# Patient Record
Sex: Female | Born: 1965 | Race: White | Hispanic: No | Marital: Married | Smoking: Never smoker
Health system: Southern US, Community
[De-identification: ages and names within clinical notes are randomized; demographics above are authoritative.]

## PROBLEM LIST (undated history)

## (undated) DIAGNOSIS — IMO0002 Reserved for concepts with insufficient information to code with codable children: Secondary | ICD-10-CM

## (undated) DIAGNOSIS — N83202 Unspecified ovarian cyst, left side: Secondary | ICD-10-CM

## (undated) DIAGNOSIS — H409 Unspecified glaucoma: Secondary | ICD-10-CM

## (undated) DIAGNOSIS — N83201 Unspecified ovarian cyst, right side: Secondary | ICD-10-CM

## (undated) DIAGNOSIS — I10 Essential (primary) hypertension: Secondary | ICD-10-CM

## (undated) DIAGNOSIS — A0472 Enterocolitis due to Clostridium difficile, not specified as recurrent: Secondary | ICD-10-CM

## (undated) DIAGNOSIS — R87619 Unspecified abnormal cytological findings in specimens from cervix uteri: Secondary | ICD-10-CM

## (undated) DIAGNOSIS — D219 Benign neoplasm of connective and other soft tissue, unspecified: Secondary | ICD-10-CM

## (undated) DIAGNOSIS — D649 Anemia, unspecified: Secondary | ICD-10-CM

## (undated) HISTORY — DX: Unspecified ovarian cyst, left side: N83.202

## (undated) HISTORY — DX: Anemia, unspecified: D64.9

## (undated) HISTORY — DX: Unspecified abnormal cytological findings in specimens from cervix uteri: R87.619

## (undated) HISTORY — DX: Enterocolitis due to Clostridium difficile, not specified as recurrent: A04.72

## (undated) HISTORY — DX: Reserved for concepts with insufficient information to code with codable children: IMO0002

## (undated) HISTORY — DX: Unspecified glaucoma: H40.9

## (undated) HISTORY — DX: Essential (primary) hypertension: I10

## (undated) HISTORY — DX: Benign neoplasm of connective and other soft tissue, unspecified: D21.9

## (undated) HISTORY — DX: Unspecified ovarian cyst, left side: N83.201

---

## 1993-05-30 HISTORY — PX: CERVICAL BIOPSY  W/ LOOP ELECTRODE EXCISION: SUR135

## 2012-10-29 ENCOUNTER — Ambulatory Visit (INDEPENDENT_AMBULATORY_CARE_PROVIDER_SITE_OTHER): Payer: PRIVATE HEALTH INSURANCE | Admitting: Obstetrics and Gynecology

## 2012-10-29 ENCOUNTER — Encounter: Payer: Self-pay | Admitting: Obstetrics and Gynecology

## 2012-10-29 VITALS — BP 130/72 | Ht 64.75 in | Wt 186.0 lb

## 2012-10-29 DIAGNOSIS — N920 Excessive and frequent menstruation with regular cycle: Secondary | ICD-10-CM

## 2012-10-29 DIAGNOSIS — Z01419 Encounter for gynecological examination (general) (routine) without abnormal findings: Secondary | ICD-10-CM

## 2012-10-29 NOTE — Progress Notes (Addendum)
Patient ID: Lindsey Giles, female   DOB: 26-Oct-1965, 47 y.o.   MRN: [id number] 47 y.o.   Married    Caucasian   female   G1P1001   here for annual exam.  Saw PCP and had W/U for fatigue.  Ferritin found to be very very low..   Hgb was 12 g  Started Fe supplement and Hgb came back better.  Records not better.  Menses are very heavy. PCP did PUS and was told she had a small cyst.  Had Ca 125 that PCP was concerned enough about that pt was referred to onc at [facility] who told her all ok and just get routine exams.  Menses are q26 days, last for 7 days, heavy for 2-3 days.  No socially embarrassing accidents.    H/O LEEP in 1995 for dysplasia.  Paps since were normal.    Patient's last menstrual period was 10/14/2012.          Sexually active: yes  The current method of family planning is none. Condoms, sex infreq.  He is 20 years older than she is.     Exercising: walking Last mammogram: 08/01/2012   Last pap smear: 12/06/10 History of abnormal pap: yes  Smoking: never Alcohol: none Last colonoscopy: never Last Bone Density: never  Last tetanus shot: 2005 Last cholesterol check: 03/2012   Hgb: 14.7g          Urine:    Family History  Problem Relation Age of Onset  . Hypertension Mother   . Hypertension Father   . Breast cancer Maternal Grandmother     meta to brain  . Alzheimer's disease Maternal Grandfather   . Colon cancer Paternal Grandmother   . Heart attack Paternal Grandfather 70    There are no active problems to display for this patient.   Past Medical History  Diagnosis Date  . Hypertension   . Anemia   . Glaucoma   . Abnormal Pap smear     Past Surgical History  Procedure Laterality Date  . Cervical biopsy  w/ loop electrode excision  1995    Allergies: Sudafed  Current Outpatient Prescriptions  Medication Sig Dispense Refill  . dorzolamide-timolol (COSOPT) 22.3-6.8 MG/ML ophthalmic solution Place 1 drop into the left eye 2 (two) times daily.      Marland Kitchen  FeFum-FePoly-FA-B Cmp-C-Biot (INTEGRA PLUS) CAPS Take 1 tablet by mouth daily.      Marland Kitchen lisinopril (PRINIVIL,ZESTRIL) 10 MG tablet Take 1 tablet by mouth daily.       No current facility-administered medications for this visit.    ROS: Pertinent items are noted in HPI.  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  Pt states she worried a lot about her health and always worries about the worst whenever she has a test.    Exam:    BP 130/72  Ht 5' 4.75" (1.645 m)  Wt 186 lb (84.369 kg)  BMI 31.18 kg/m2  LMP 10/14/2012   Wt Readings from Last 3 Encounters:  10/29/12 186 lb (84.369 kg)     Ht Readings from Last 3 Encounters:  10/29/12 5' 4.75" (1.645 m)    General appearance: alert, cooperative and appears stated age Head: Normocephalic, without obvious abnormality, atraumatic Neck: no adenopathy, supple, symmetrical, trachea midline and thyroid not enlarged, symmetric, no tenderness/mass/nodules Lungs: clear to auscultation  bilaterally Breasts: Inspection negative, No nipple retraction or dimpling, No nipple discharge or bleeding, No axillary or supraclavicular adenopathy, Normal to palpation without dominant masses Heart: regular rate and rhythm Abdomen: soft, non-tender; bowel sounds normal; no masses,  no organomegaly Extremities: extremities normal, atraumatic, no cyanosis or edema Skin: Skin color, texture, turgor normal. No rashes or lesions Lymph nodes: Cervical, supraclavicular, and axillary nodes normal. No abnormal inguinal nodes palpated Neurologic: Grossly normal   Pelvic: External genitalia:  no lesions              Urethra:  normal appearing urethra with no masses, tenderness or lesions              Bartholins and Skenes: normal                 Vagina: normal appearing vagina with normal color and discharge, no lesions               Cervix: normal appearance              Pap taken: no        Bimanual Exam:  Uterus:  uterus is normal size, shape, consistency and nontender, mid position, mobile.  Fundus firm, maybe a small fibroid.                                      Adnexa: normal adnexa in size, nontender and no masses                                      Rectovaginal: Confirms                                      Anus:  normal sphincter tone, no lesions  A: normal perimenopausal exam     menorhagia     Glaucoma s/p traumatic injury when young     HTN     P: mammogram counseled on breast self exam, mammography screening, adequate intake of calcium and vitamin D, diet and exercise return annually or prn   Check SHSG and PUS, then approp mgt.    An After Visit Summary was printed and given to the patient.   Review of records:  PUS done September 20, 2012 showed the uterus was 14 x 4 x 8 cm, no discrete fibroids seen.  EMS 3mm.  Rt ovary with a 5 x 2 x 3 cm septated cyst.  Left ovary nl.  She was advised to start Depo-provera to help the bleeding, but she did not want to do that.  He also rec: an upper and lower endoscopy, which pt is reluctant to do as well.    In 2007, she was seen at [facility] and had a PUS showing her uterus was 10 x 5 x 7 with three small fibroids about 1 cm each, possible adenomyosis, and an endometrial polyp.  Both adnexa had small paraovarian cysts.

## 2012-10-29 NOTE — Progress Notes (Signed)
Very heavy first three days, then lighter 3-4 days, then heavy last day

## 2012-10-29 NOTE — Patient Instructions (Addendum)

## 2012-10-30 ENCOUNTER — Telehealth: Payer: Self-pay | Admitting: Obstetrics and Gynecology

## 2012-10-30 NOTE — Telephone Encounter (Signed)
Patient calling re: hoped to have ultrasound tomorrow, 10/31/12, as her insurance will be invalid this Friday due to starting a new job. Patient has not heard back from Carolynn yet who is out of the office this afternoon. If it's possible to get the patient in tomorrow afternoon, please let her know. She will need at least 1.5 hours notice to drive to the appointment.

## 2012-10-31 NOTE — Telephone Encounter (Signed)
Advised patient that no available appointments this week.  Ultrasound schedule is full and have not had cancelations. Patient states this is for menorrhagia and she is ok to wait a few weeks until she can take a day off work.  Briefly reviewed procedure and that most people do not need a whole day off work, can sched early am or late pm appt to assist her.  She will call me back to schedule once she has time at new job.

## 2012-11-23 ENCOUNTER — Telehealth: Payer: Self-pay | Admitting: Obstetrics and Gynecology

## 2012-11-23 NOTE — Telephone Encounter (Signed)
LMTCB to discuss insurance benefits and schedule pus-shgm.

## 2012-12-11 NOTE — Telephone Encounter (Signed)
Last Aex 10/29/2012 Patient notified of notes stating No Pap test done at visit due to guide lines now of her already having normal pap smear in past year. Patient states will call back and speak to Eber Jones when gets new insurance card at job she is at now and schedule appointment for PUS/ [facility] when she has time off from work.

## 2012-12-11 NOTE — Telephone Encounter (Signed)
Pap results

## 2012-12-27 ENCOUNTER — Telehealth: Payer: Self-pay | Admitting: Obstetrics and Gynecology

## 2012-12-27 NOTE — Telephone Encounter (Signed)
LMTCB with her husband. She was not home.

## 2013-03-13 DIAGNOSIS — Z9849 Cataract extraction status, unspecified eye: Secondary | ICD-10-CM | POA: Insufficient documentation

## 2013-03-30 HISTORY — PX: OTHER SURGICAL HISTORY: SHX169

## 2013-04-04 ENCOUNTER — Other Ambulatory Visit: Payer: Self-pay

## 2013-07-29 ENCOUNTER — Telehealth: Payer: Self-pay | Admitting: Obstetrics and Gynecology

## 2013-07-29 NOTE — Telephone Encounter (Signed)
Patient thinks she needs a pelvic ultrasound and SHSG. Patient is a previous patient of Dr. Joan Flores.  Patient is also having side pain when she is lying down. She is wondering if a MMG would reveal the cause of the side pain.

## 2013-07-30 NOTE — Telephone Encounter (Signed)
I will be happy to see the patient on 08/02/13 to address her concerns. I will close the encounter.

## 2013-07-30 NOTE — Telephone Encounter (Signed)
Spoke with pt who reports she has noticed a lumpy area near her left breast when she lays on her side. Pt says she has always had "lumpy breasts" but this feels different. Pt also has been having irregular periods since seeing CR back in May. Pt has gone 2-3 months without a cycle and recently had spotting with lower abdominal pain. Pt reports CR had recommended a SHGM at her last visit, but pt has switched jobs a few times and couldn't work it out with her schedule. Pt is finally settled with her current job and has some sick days she can use now.  Pt has been told by another doctor that she has a cyst on her ovary and that she was anemic. Pt's records were reviewed by an oncologist at [facility] but he didn't feel he needed to see her. Advised pt to come in as soon as she can to get her breast evaluated, and that Dr. Quincy Simmonds can assess any other concerns and need for [facility] at that time, since she has not been seen by BS yet. Pt agreeable to appt 08-02-13 at 9:30 with BS.

## 2013-08-02 ENCOUNTER — Encounter: Payer: Self-pay | Admitting: Obstetrics and Gynecology

## 2013-08-02 ENCOUNTER — Ambulatory Visit (INDEPENDENT_AMBULATORY_CARE_PROVIDER_SITE_OTHER): Payer: BC Managed Care – PPO | Admitting: Obstetrics and Gynecology

## 2013-08-02 VITALS — BP 138/88 | HR 80 | Resp 16 | Ht 64.75 in | Wt 198.0 lb

## 2013-08-02 DIAGNOSIS — N83201 Unspecified ovarian cyst, right side: Secondary | ICD-10-CM

## 2013-08-02 DIAGNOSIS — N83209 Unspecified ovarian cyst, unspecified side: Secondary | ICD-10-CM

## 2013-08-02 DIAGNOSIS — N644 Mastodynia: Secondary | ICD-10-CM

## 2013-08-02 NOTE — Progress Notes (Signed)
Patient scheduled for dx MMG and L Breast US at [facility] for 3/18 at 1015. Patient agreeable to time/date/location. Placed in hold.    Patient scheduled for PUS 08/29/13. Pelvic U/S scheduled and patient aware/agreeable to time.  Patient verbalized understanding of the U/S appointment cancellation policy. Advised will need to cancel within 72 business hours (3 business days) or will have $100.00 no show fee placed to account.    Records request faxed to [facility] with request to send records to the [facility].  Fax confirmation received.

## 2013-08-02 NOTE — Patient Instructions (Signed)
We will call you to schedule the pelvic ultrasound after we contact your insurance company.

## 2013-08-02 NOTE — Progress Notes (Signed)
1443: Patient called to r/s PUS appt, requests 09/26/13. Appointment scheduled for date and time of choice.

## 2013-08-02 NOTE — Progress Notes (Signed)
Patient ID: Lindsey Giles, female   DOB: 1965/05/31, 48 y.o.   MRN: [id number] GYNECOLOGY VISIT  PCP:   [place]  Referring provider:   HPI: 48 y.o.   Married  Caucasian  female   G1P1001 with Patient's last menstrual period was 07/30/2013.   here for  Left breast tenderness (more under left arm) for one month.  Hurts to lean on her left side - when bra rubs.   Feels muscle soreness. Pain occurred once in a while int past.  No known increase in activity.  No injuries.  Maternal grandmother with breast cancer.  Cousin with breast cancer.   Patient's prior LMP 04/2013 normal.  This menses was a little heavy last 2 days. Changing tampon every 1 1/2 hours.   Last year told by Dr. Joan Flores that she had a cyst on her ovary and that ultrasound and sonohysterogram were recommended.  4.9 cm septated exophytic cyst of the right ovary, left ovary normal, uterus normal, EMS 0.3 cm at outside facility 09/20/12.  Had CA125 by her PCP - thinks it was 12 - 14.  Patient had a phone consult from PCP to oncology at [facility] who said to just follow up with GYN.  This lead to recommendation for pelvic ultrasound by Dr. Joan Flores, but this was not done. History of ruptured ovarian cyst as a young woman.   Hgb:    Not done Urine:  Not done  GYNECOLOGIC HISTORY: Patient's last menstrual period was 07/30/2013. Sexually active:  yes Partner preference: female Contraception:   condoms Menopausal hormone therapy: n/a DES exposure:   no Blood transfusions:   no Sexually transmitted diseases:   ?HPV GYN procedures and prior surgeries:  LEEP 1994 Last mammogram:   08-01-12 wnl: [place] [place] [facility].          Last pap and high risk HPV testing:   12-06-10 wnl:no HPV testing History of abnormal pap smear:  1994 had colposcopy and LEEP procedure.  Paps normal since.   OB History   Grav Para Term Preterm Abortions TAB SAB Ect Mult Living   1 1 1       1        LIFESTYLE: Exercise:     walking          Tobacco:  no Alcohol:  no Drug use:  no  There are no active problems to display for this patient.   Past Medical History  Diagnosis Date  . Hypertension   . Anemia   . Glaucoma   . Abnormal Pap smear     Past Surgical History  Procedure Laterality Date  . Cervical biopsy  w/ loop electrode excision  1995  . Cataract surgery Left 03/2013    Current Outpatient Prescriptions  Medication Sig Dispense Refill  . dorzolamide-timolol (COSOPT) 22.3-6.8 MG/ML ophthalmic solution Place 1 drop into the left eye 2 (two) times daily.      . ferrous sulfate dried (SLOW FE) 160 (50 FE) MG TBCR SR tablet Take 160 mg by mouth daily.      Marland Kitchen lisinopril (PRINIVIL,ZESTRIL) 10 MG tablet Take 1 tablet by mouth daily.      . metroNIDAZOLE (METROGEL) 0.75 % gel Apply 1 application topically 2 (two) times daily.       No current facility-administered medications for this visit.     ALLERGIES: Sudafed  Family History  Problem Relation Age of Onset  . Hypertension Mother   . Hypertension Father   . Breast cancer Maternal  Grandmother     meta to brain  . Alzheimer's disease Maternal Grandfather   . Colon cancer Paternal Grandmother   . Heart attack Paternal Grandfather 60    History   Social History  . Marital Status: Married    Spouse Name: N/A    Number of Children: N/A  . Years of Education: N/A   Occupational History  . Not on file.   Social History Main Topics  . Smoking status: Never Smoker   . Smokeless tobacco: Never Used  . Alcohol Use: No  . Drug Use: No  . Sexual Activity: Yes    Partners: Male    Birth Control/ Protection: Condom   Other Topics Concern  . Not on file   Social History Narrative  . No narrative on file    ROS:  Pertinent items are noted in HPI.  PHYSICAL EXAMINATION:    BP 138/88  Pulse 80  Resp 16  Ht 5' 4.75" (1.645 m)  Wt 198 lb (89.812 kg)  BMI 33.19 kg/m2  LMP 07/30/2013   Wt Readings from Last 3 Encounters:  08/02/13 198 lb (89.812 kg)   10/29/12 186 lb (84.369 kg)     Ht Readings from Last 3 Encounters:  08/02/13 5' 4.75" (1.645 m)  10/29/12 5' 4.75" (1.645 m)    General appearance: alert, cooperative and appears stated age Lungs: clear to auscultation bilaterally Breasts: Inspection negative, No nipple retraction or dimpling, No nipple discharge or bleeding, No axillary or supraclavicular adenopathy, Normal to palpation without dominant masses Heart: regular rate and rhythm Abdomen: soft, non-tender; no masses,  no organomegaly Extremities: extremities normal, atraumatic, no cyanosis or edema Skin: Skin color, texture, turgor normal. No rashes or lesions Lymph nodes: Cervical, supraclavicular, and axillary nodes normal. No abnormal inguinal nodes palpated Neurologic: Grossly normal  Pelvic: External genitalia:  no lesions              Urethra:  normal appearing urethra with no masses, tenderness or lesions              Bartholins and Skenes: normal                 Vagina: normal appearing vagina with normal color and discharge, no lesions.  Menstrual flow.              Cervix: normal appearance                 Bimanual Exam:  Uterus:  uterus is normal size, shape, consistency and nontender                                      Adnexa: normal adnexa in size, nontender and no masses                                    ASSESSMENT Left mastalgia. Right ovarian cyst on prior ultrasound. Skipping menses.  PLAN Bilateral diagnostic mammogram and left breast ultrasound. Return for pelvic ultrasound.   An After Visit Summary was printed and given to the patient.  35 minutes face to face time of which over 50% was spent in counseling.

## 2013-08-05 ENCOUNTER — Telehealth: Payer: Self-pay | Admitting: Obstetrics and Gynecology

## 2013-08-05 NOTE — Telephone Encounter (Signed)
Left message for patient to call back. Need to discuss insurance quote for upcoming service.

## 2013-08-06 NOTE — Telephone Encounter (Signed)
Patient called back. I advised patient that per quoted insurance benefits patient liability for PUS will be $286.65. Payment is due at the time of service. Patient agreeable

## 2013-08-14 ENCOUNTER — Ambulatory Visit
Admission: RE | Admit: 2013-08-14 | Discharge: 2013-08-14 | Disposition: A | Discharge status: Home / Self Care | Payer: BC Managed Care – PPO | Source: Ambulatory Visit | Attending: Obstetrics and Gynecology | Admitting: Obstetrics and Gynecology

## 2013-08-14 DIAGNOSIS — N644 Mastodynia: Secondary | ICD-10-CM

## 2013-08-29 ENCOUNTER — Other Ambulatory Visit: Payer: BC Managed Care – PPO | Admitting: Obstetrics and Gynecology

## 2013-08-29 ENCOUNTER — Other Ambulatory Visit: Payer: BC Managed Care – PPO

## 2013-09-11 ENCOUNTER — Encounter: Payer: Self-pay | Admitting: Obstetrics and Gynecology

## 2013-09-26 ENCOUNTER — Ambulatory Visit (INDEPENDENT_AMBULATORY_CARE_PROVIDER_SITE_OTHER): Payer: BC Managed Care – PPO | Admitting: Obstetrics and Gynecology

## 2013-09-26 ENCOUNTER — Ambulatory Visit (INDEPENDENT_AMBULATORY_CARE_PROVIDER_SITE_OTHER): Payer: BC Managed Care – PPO

## 2013-09-26 ENCOUNTER — Encounter: Payer: Self-pay | Admitting: Obstetrics and Gynecology

## 2013-09-26 VITALS — BP 130/84 | HR 80 | Ht 64.75 in | Wt 199.0 lb

## 2013-09-26 DIAGNOSIS — N83209 Unspecified ovarian cyst, unspecified side: Secondary | ICD-10-CM

## 2013-09-26 DIAGNOSIS — D219 Benign neoplasm of connective and other soft tissue, unspecified: Secondary | ICD-10-CM

## 2013-09-26 DIAGNOSIS — N83202 Unspecified ovarian cyst, left side: Principal | ICD-10-CM

## 2013-09-26 DIAGNOSIS — D259 Leiomyoma of uterus, unspecified: Secondary | ICD-10-CM

## 2013-09-26 DIAGNOSIS — N920 Excessive and frequent menstruation with regular cycle: Secondary | ICD-10-CM

## 2013-09-26 DIAGNOSIS — N83201 Unspecified ovarian cyst, right side: Secondary | ICD-10-CM

## 2013-09-26 NOTE — Patient Instructions (Signed)
Please call if your menstruation becomes too heavy! We have options for treatment.

## 2013-09-26 NOTE — Progress Notes (Signed)
Ultrasound - report and images reviewed with the patient.

## 2013-09-26 NOTE — Progress Notes (Signed)
Patient ID: Lindsey Giles, female   DOB: [birth year], 48 y.o.   MRN: [id number] GYNECOLOGY  VISIT   HPI: 48 y.o.   Married  Caucasian  female   G1P1001 with Patient's last menstrual period was 09/17/2013.   here for  Pelvic ultrasound.   Last year told by Dr. Joan Flores that she had a cyst on her ovary and that ultrasound and sonohysterogram were recommended. 4.9 cm septated exophytic cyst of the right ovary, left ovary normal, uterus normal, EMS 0.3 cm at outside facility 09/20/12. Had CA125 by her PCP - thinks it was 12 - 14. Patient had a phone consult from PCP to oncology at [facility] who said to just follow up with GYN. This lead to recommendation for pelvic ultrasound by Dr. Joan Flores, but this was not done. History of ruptured ovarian cyst as a young woman.   Menses can be heavy.  Pad change up to every 1 1/2 hours.  Starting to skip cycles.   History of anemia.  Declines blood work. Sees PCP next month.   GYNECOLOGIC HISTORY: Patient's last menstrual period was 09/17/2013. Contraception:   condoms Menopausal hormone therapy: n/a        OB History   Grav Para Term Preterm Abortions TAB SAB Ect Mult Living   1 1 1       1          There are no active problems to display for this patient.   Past Medical History  Diagnosis Date  . Hypertension   . Anemia   . Glaucoma   . Abnormal Pap smear     Past Surgical History  Procedure Laterality Date  . Cervical biopsy  w/ loop electrode excision  1995  . Cataract surgery Left 03/2013    Current Outpatient Prescriptions  Medication Sig Dispense Refill  . dorzolamide-timolol (COSOPT) 22.3-6.8 MG/ML ophthalmic solution Place 1 drop into the left eye 2 (two) times daily.      . ferrous sulfate dried (SLOW FE) 160 (50 FE) MG TBCR SR tablet Take 160 mg by mouth daily.      Marland Kitchen lisinopril (PRINIVIL,ZESTRIL) 10 MG tablet Take 1 tablet by mouth daily.      . metroNIDAZOLE (METROGEL) 0.75 % gel Apply 1 application topically 2 (two) times daily.        No current facility-administered medications for this visit.     ALLERGIES: Sudafed  Family History  Problem Relation Age of Onset  . Hypertension Mother   . Hypertension Father   . Breast cancer Maternal Grandmother     meta to brain  . Alzheimer's disease Maternal Grandfather   . Colon cancer Paternal Grandmother   . Heart attack Paternal Grandfather 47    History   Social History  . Marital Status: Married    Spouse Name: N/A    Number of Children: N/A  . Years of Education: N/A   Occupational History  . Not on file.   Social History Main Topics  . Smoking status: Never Smoker   . Smokeless tobacco: Never Used  . Alcohol Use: No  . Drug Use: No  . Sexual Activity: Yes    Partners: Male    Birth Control/ Protection: Condom   Other Topics Concern  . Not on file   Social History Narrative  . No narrative on file    ROS:  Pertinent items are noted in HPI.  PHYSICAL EXAMINATION:    BP 130/84  Pulse 80  Ht 5' 4.75" (1.645 m)  Wt 199 lb (90.266 kg)  BMI 33.36 kg/m2  LMP 09/17/2013     General appearance: alert, cooperative and appears stated age  Ultrasound -  2 fibroids - 16 mm and 19 mm. EMS - 11.87 mm. Right ovary 1 - 2 cm below umbilicus in right lateral quadrant.  39 x 28 m avascular echofree cyst to the right of the ovary.  Left ovary with thin walled cyst 28 x 19 mm.   ASSESSMENT  Bilateral ovarian cysts. Small uterine fibroids. Menorrhagia.   PLAN  Discussion regarding above assessment diagnoses.  Observation of ovarian cysts. Consider yearly ultrasound. I discussed birth control pills and Mirena IUD if menorrhagia becomes problematic for patient.  Return for annual exam and prn.    An After Visit Summary was printed and given to the patient.  __30____ minutes face to face time of which over 50% was spent in counseling.

## 2013-12-13 ENCOUNTER — Ambulatory Visit: Payer: PRIVATE HEALTH INSURANCE | Admitting: Obstetrics and Gynecology

## 2013-12-13 ENCOUNTER — Ambulatory Visit: Payer: PRIVATE HEALTH INSURANCE | Admitting: Gynecology

## 2013-12-13 ENCOUNTER — Ambulatory Visit: Payer: BC Managed Care – PPO | Admitting: Obstetrics and Gynecology

## 2014-01-03 ENCOUNTER — Encounter: Payer: Self-pay | Admitting: Obstetrics and Gynecology

## 2014-01-03 ENCOUNTER — Ambulatory Visit (INDEPENDENT_AMBULATORY_CARE_PROVIDER_SITE_OTHER): Payer: BC Managed Care – PPO | Admitting: Obstetrics and Gynecology

## 2014-01-03 VITALS — BP 104/58 | HR 84 | Resp 14 | Ht 64.5 in | Wt 194.8 lb

## 2014-01-03 DIAGNOSIS — Z01419 Encounter for gynecological examination (general) (routine) without abnormal findings: Secondary | ICD-10-CM

## 2014-01-03 DIAGNOSIS — Z23 Encounter for immunization: Secondary | ICD-10-CM

## 2014-01-03 DIAGNOSIS — Z0000 Encounter for general adult medical examination without abnormal findings: Secondary | ICD-10-CM

## 2014-01-03 LAB — POCT URINALYSIS DIPSTICK
BILIRUBIN UA: NEGATIVE
GLUCOSE UA: NEGATIVE
Ketones, UA: NEGATIVE
Leukocytes, UA: NEGATIVE
Nitrite, UA: NEGATIVE
Protein, UA: NEGATIVE
Urobilinogen, UA: NEGATIVE
pH, UA: 5

## 2014-01-03 MED ORDER — NORETHINDRONE 0.35 MG PO TABS: 1.0000 | ORAL_TABLET | Freq: Every day | ORAL | Status: DC

## 2014-01-03 NOTE — Patient Instructions (Signed)
EXERCISE AND DIET:  We recommended that you start or continue a regular exercise program for good health. Regular exercise means any activity that makes your heart beat faster and makes you sweat.  We recommend exercising at least 30 minutes per day at least 3 days a week, preferably 4 or 5.  We also recommend a diet low in fat and sugar.  Inactivity, poor dietary choices and obesity can cause diabetes, heart attack, stroke, and kidney damage, among others.    ALCOHOL AND SMOKING:  Women should limit their alcohol intake to no more than 7 drinks/beers/glasses of wine (combined, not each!) per week. Moderation of alcohol intake to this level decreases your risk of breast cancer and liver damage. And of course, no recreational drugs are part of a healthy lifestyle.  And absolutely no smoking or even second hand smoke. Most people know smoking can cause heart and lung diseases, but did you know it also contributes to weakening of your bones? Aging of your skin?  Yellowing of your teeth and nails?  CALCIUM AND VITAMIN D:  Adequate intake of calcium and Vitamin D are recommended.  The recommendations for exact amounts of these supplements seem to change often, but generally speaking 600 mg of calcium (either carbonate or citrate) and 800 units of Vitamin D per day seems prudent. Certain women may benefit from higher intake of Vitamin D.  If you are among these women, your doctor will have told you during your visit.    PAP SMEARS:  Pap smears, to check for cervical cancer or precancers,  have traditionally been done yearly, although recent scientific advances have shown that most women can have pap smears less often.  However, every woman still should have a physical exam from her gynecologist every year. It will include a breast check, inspection of the vulva and vagina to check for abnormal growths or skin changes, a visual exam of the cervix, and then an exam to evaluate the size and shape of the uterus and  ovaries.  And after 48 years of age, a rectal exam is indicated to check for rectal cancers. We will also provide age appropriate advice regarding health maintenance, like when you should have certain vaccines, screening for sexually transmitted diseases, bone density testing, colonoscopy, mammograms, etc.   MAMMOGRAMS:  All women over 40 years old should have a yearly mammogram. Many facilities now offer a "3D" mammogram, which may cost around $50 extra out of pocket. If possible,  we recommend you accept the option to have the 3D mammogram performed.  It both reduces the number of women who will be called back for extra views which then turn out to be normal, and it is better than the routine mammogram at detecting truly abnormal areas.    COLONOSCOPY:  Colonoscopy to screen for colon cancer is recommended for all women at age 50.  We know, you hate the idea of the prep.  We agree, BUT, having colon cancer and not knowing it is worse!!  Colon cancer so often starts as a polyp that can be seen and removed at colonscopy, which can quite literally save your life!  And if your first colonoscopy is normal and you have no family history of colon cancer, most women don't have to have it again for 10 years.  Once every ten years, you can do something that may end up saving your life, right?  We will be happy to help you get it scheduled when you are ready.    Be sure to check your insurance coverage so you understand how much it will cost.  It may be covered as a preventative service at no cost, but you should check your particular policy.     Norethindrone tablets (contraception) What is this medicine? NORETHINDRONE (nor eth IN drone) is an oral contraceptive. The product contains a female hormone known as a progestin. It is used to prevent pregnancy. This medicine may be used for other purposes; ask your health care provider or pharmacist if you have questions. COMMON BRAND NAME(S): Camila, Deblitane 28-Day,  Errin, Heather, [place], Jolivette, Donna, Nor-QD, Nora-BE, Norlyroc, Ortho Micronor, [employer] 28-Day What should I tell my health care provider before I take this medicine? They need to know if you have any of these conditions: -blood vessel disease or blood clots -breast, cervical, or vaginal cancer -diabetes -heart disease -kidney disease -liver disease -mental depression -migraine -seizures -stroke -vaginal bleeding -an unusual or allergic reaction to norethindrone, other medicines, foods, dyes, or preservatives -pregnant or trying to get pregnant -breast-feeding How should I use this medicine? Take this medicine by mouth with a glass of water. You may take it with or without food. Follow the directions on the prescription label. Take this medicine at the same time each day and in the order directed on the package. Do not take your medicine more often than directed. Contact your pediatrician regarding the use of this medicine in children. Special care may be needed. This medicine has been used in female children who have started having menstrual periods. A patient package insert for the product will be given with each prescription and refill. Read this sheet carefully each time. The sheet may change frequently. Overdosage: If you think you have taken too much of this medicine contact a poison control center or emergency room at once. NOTE: This medicine is only for you. Do not share this medicine with others. What if I miss a dose? Try not to miss a dose. Every time you miss a dose or take a dose late your chance of pregnancy increases. When 1 pill is missed (even if only 3 hours late), take the missed pill as soon as possible and continue taking a pill each day at the regular time (use a back up method of birth control for the next 48 hours). If more than 1 dose is missed, use an additional birth control method for the rest of your pill pack until menses occurs. Contact your health care  professional if more than 1 dose has been missed. What may interact with this medicine? Do not take this medicine with any of the following medications: -amprenavir or fosamprenavir -bosentan This medicine may also interact with the following medications: -antibiotics or medicines for infections, especially rifampin, rifabutin, rifapentine, and griseofulvin, and possibly penicillins or tetracyclines -aprepitant -barbiturate medicines, such as phenobarbital -carbamazepine -felbamate -modafinil -oxcarbazepine -phenytoin -ritonavir or other medicines for HIV infection or AIDS -St. John's wort -topiramate This list may not describe all possible interactions. Give your health care provider a list of all the medicines, herbs, non-prescription drugs, or dietary supplements you use. Also tell them if you smoke, drink alcohol, or use illegal drugs. Some items may interact with your medicine. What should I watch for while using this medicine? Visit your doctor or health care professional for regular checks on your progress. You will need a regular breast and pelvic exam and Pap smear while on this medicine. Use an additional method of birth control during the first cycle that  you take these tablets. If you have any reason to think you are pregnant, stop taking this medicine right away and contact your doctor or health care professional. If you are taking this medicine for hormone related problems, it may take several cycles of use to see improvement in your condition. This medicine does not protect you against HIV infection (AIDS) or any other sexually transmitted diseases. What side effects may I notice from receiving this medicine? Side effects that you should report to your doctor or health care professional as soon as possible: -breast tenderness or discharge -pain in the abdomen, chest, groin or leg -severe headache -skin rash, itching, or hives -sudden shortness of breath -unusually weak  or tired -vision or speech problems -yellowing of skin or eyes Side effects that usually do not require medical attention (report to your doctor or health care professional if they continue or are bothersome): -changes in sexual desire -change in menstrual flow -facial hair growth -fluid retention and swelling -headache -irritability -nausea -weight gain or loss This list may not describe all possible side effects. Call your doctor for medical advice about side effects. You may report side effects to FDA at 1-800-FDA-1088. Where should I keep my medicine? Keep out of the reach of children. Store at room temperature between 15 and 30 degrees C (59 and 86 degrees F). Throw away any unused medicine after the expiration date. NOTE: This sheet is a summary. It may not cover all possible information. If you have questions about this medicine, talk to your doctor, pharmacist, or health care provider.  2015, Elsevier/Gold Standard. (2012-02-03 16:41:35)  

## 2014-01-03 NOTE — Progress Notes (Signed)
Patient ID: Lindsey Giles, female   DOB: Sep 17, 1965, 48 y.o.   MRN: [id number] GYNECOLOGY VISIT  PCP:   [place]  Referring provider:   HPI: 48 y.o.   Married  Caucasian  female   G1P1001 with Patient's last menstrual period was 12/25/2013.   here for   AEX. This menses is a little more crampy but not too heavy.  Has borderline anemia.  Would like more reliable contraception.   History of HTN.  Ultrasound - 09/26/13 2 fibroids - 16 mm and 19 mm.  EMS - 11.87 mm.  Right ovary 1 - 2 cm below umbilicus in right lateral quadrant. 39 x 28 m avascular echofree cyst to the right of the ovary. Left ovary with thin walled cyst 28 x 19 mm.  Plan for yearly ultrasound.   Son going off to school.  Hgb:    PCP Urine:  Trace RBC's--see LMP--pt. Still spotting  GYNECOLOGIC HISTORY: Patient's last menstrual period was 12/25/2013. Sexually active:  yes Partner preference: female Contraception:  condoms  Menopausal hormone therapy: n/a DES exposure: no   Blood transfusions:   no Sexually transmitted diseases:  no  GYN procedures and prior surgeries:  Colposcopy and LEEP procedure in 1995. Last mammogram: 08-14-13 wnl and had normal ultrasound of left breast:[facility]. Screening mammogram due in March 2016.          Last pap and high risk HPV testing: 12-06-10 wnl  History of abnormal pap smear: 1995 pap with dysplasia and had colposcopy and LEEP procedure.  Paps normal since.    OB History   Grav Para Term Preterm Abortions TAB SAB Ect Mult Living   1 1 1       1        LIFESTYLE: Exercise:   walking            Tobacco:   no Alcohol:    no Drug use:  no  OTHER HEALTH MAINTENANCE: Tetanus/TDap:   2005 Gardisil:              n/a Influenza:           never Zostavax:           n/a  Bone density:      n/a Colonoscopy:    n/a  Cholesterol check:   Per patient 186 -18 months ago  Family History  Problem Relation Age of Onset  . Hypertension Mother   .  Hypertension Father   . Breast cancer Maternal Grandmother     meta to brain  . Alzheimer's disease Maternal Grandfather   . Colon cancer Paternal Grandmother   . Heart attack Paternal Grandfather 70    There are no active problems to display for this patient.  Past Medical History  Diagnosis Date  . Hypertension   . Anemia   . Glaucoma   . Abnormal Pap smear     Past Surgical History  Procedure Laterality Date  . Cervical biopsy  w/ loop electrode excision  1995  . Cataract surgery Left 03/2013    ALLERGIES: Sudafed  Current Outpatient Prescriptions  Medication Sig Dispense Refill  . dorzolamide-timolol (COSOPT) 22.3-6.8 MG/ML ophthalmic solution Place 1 drop into the left eye 2 (two) times daily.      . ferrous sulfate dried (SLOW FE) 160 (50 FE) MG TBCR SR tablet Take 160 mg by mouth daily. Takes with menstrual cycles      . lisinopril (PRINIVIL,ZESTRIL) 10 MG tablet Take 1 tablet  by mouth daily.      . metroNIDAZOLE (METROGEL) 0.75 % gel Apply 1 application topically 2 (two) times daily.       No current facility-administered medications for this visit.     ROS:  Pertinent items are noted in HPI.  SOCIAL HISTORY:  [employer].   PHYSICAL EXAMINATION:    BP 104/58  Pulse 84  Resp 14  Ht 5' 4.5" (1.638 m)  Wt 194 lb 12.8 oz (88.361 kg)  BMI 32.93 kg/m2  LMP 12/25/2013   Wt Readings from Last 3 Encounters:  01/03/14 194 lb 12.8 oz (88.361 kg)  09/26/13 199 lb (90.266 kg)  08/02/13 198 lb (89.812 kg)     Ht Readings from Last 3 Encounters:  01/03/14 5' 4.5" (1.638 m)  09/26/13 5' 4.75" (1.645 m)  08/02/13 5' 4.75" (1.645 m)    General appearance: alert, cooperative and appears stated age Head: Normocephalic, without obvious abnormality, atraumatic Neck: no adenopathy, supple, symmetrical, trachea midline and thyroid not enlarged, symmetric, no tenderness/mass/nodules Lungs: clear to auscultation bilaterally Breasts: Inspection negative, No  nipple retraction or dimpling, No nipple discharge or bleeding, No axillary or supraclavicular adenopathy, Normal to palpation without dominant masses Heart: regular rate and rhythm Abdomen: soft, non-tender; no masses,  no organomegaly Extremities: extremities normal, atraumatic, no cyanosis or edema Skin: Skin color, texture, turgor normal. No rashes or lesions Lymph nodes: Cervical, supraclavicular, and axillary nodes normal. No abnormal inguinal nodes palpated Neurologic: Grossly normal  Pelvic: External genitalia:  no lesions              Urethra:  normal appearing urethra with no masses, tenderness or lesions              Bartholins and Skenes: normal                 Vagina: normal appearing vagina with normal color and discharge, no lesions              Cervix: normal appearance              Pap and high risk HPV testing done: Yes.  .            Bimanual Exam:  Uterus:  uterus is normal size, shape, consistency and nontender                                      Adnexa: normal adnexa in size, nontender and no masses                                      Rectovaginal: Confirms                                      Anus:  normal sphincter tone, no lesions  ASSESSMENT  Normal gynecologic exam. Bilateral ovarian cysts.  History of menorrhagia. History of LEEP. Hypertension.  PLAN  Mammogram recommended yearly.  Pap smear and high risk HPV testing performed.  Counseled on self breast exam, Calcium and vitamin D intake, exercise. Yearly pelvic ultrasound.  Due next spring roughly. Trial of Ortho Micronor.  Instructed in use.  See Epic orders.  TDap.  Return annually or prn   An After Visit Summary was printed  and given to the patient.

## 2014-01-07 LAB — IPS PAP TEST WITH HPV

## 2014-03-31 ENCOUNTER — Encounter: Payer: Self-pay | Admitting: Obstetrics and Gynecology

## 2014-09-26 DIAGNOSIS — H4032X1 Glaucoma secondary to eye trauma, left eye, mild stage: Secondary | ICD-10-CM | POA: Insufficient documentation

## 2014-12-29 DIAGNOSIS — A0472 Enterocolitis due to Clostridium difficile, not specified as recurrent: Secondary | ICD-10-CM

## 2014-12-29 HISTORY — DX: Enterocolitis due to Clostridium difficile, not specified as recurrent: A04.72

## 2015-01-14 ENCOUNTER — Other Ambulatory Visit: Payer: Self-pay | Admitting: *Deleted

## 2015-01-14 MED ORDER — NORETHINDRONE 0.35 MG PO TABS: 1.0000 | ORAL_TABLET | Freq: Every day | ORAL | Status: DC

## 2015-01-14 NOTE — Telephone Encounter (Signed)
Please inform patient of need to do mammogram if it has not already been done.  Micronor refilled until appointment with me.   Thanks.

## 2015-01-14 NOTE — Telephone Encounter (Addendum)
Patient called to reschedule her AEX with Dr. Quincy Simmonds whichw as scheduled for 01/23/15. Scheduled AEX for 02/27/15 with Dr. Quincy Simmonds, patient says she needs a refill on Norethindrone to last her until appt.   Pharmacy confirmed.  Medication refill request: Norethindrone 0.35 mg  Last AEX:  01/03/14 with BS  Next AEX: 02/27/15 with BS Last MMG (if hormonal medication request): 08/14/13 breast density category b: bi-rads 1: neg Refill authorized: #1/1 rfs

## 2015-01-15 NOTE — Telephone Encounter (Signed)
Tried calling patient she has does not have a voicemail box set up.

## 2015-01-19 NOTE — Telephone Encounter (Signed)
Called and s/w patient she said that she is scheduled with Dr. Quincy Simmonds for her AEX next month and the next thing on her list is to call and schedule and have her mammogram done.

## 2015-01-19 NOTE — Telephone Encounter (Signed)
Message received.

## 2015-01-23 ENCOUNTER — Ambulatory Visit: Payer: BC Managed Care – PPO | Admitting: Obstetrics and Gynecology

## 2015-02-17 ENCOUNTER — Other Ambulatory Visit: Payer: Self-pay

## 2015-02-17 DIAGNOSIS — Z1231 Encounter for screening mammogram for malignant neoplasm of breast: Secondary | ICD-10-CM

## 2015-02-27 ENCOUNTER — Encounter: Payer: Self-pay | Admitting: Obstetrics and Gynecology

## 2015-02-27 ENCOUNTER — Ambulatory Visit
Admission: RE | Admit: 2015-02-27 | Discharge: 2015-02-27 | Disposition: A | Discharge status: Home / Self Care | Payer: BLUE CROSS/BLUE SHIELD | Source: Ambulatory Visit

## 2015-02-27 ENCOUNTER — Ambulatory Visit (INDEPENDENT_AMBULATORY_CARE_PROVIDER_SITE_OTHER): Payer: BLUE CROSS/BLUE SHIELD | Admitting: Obstetrics and Gynecology

## 2015-02-27 VITALS — BP 120/60 | HR 88 | Resp 16 | Ht 64.75 in | Wt 174.0 lb

## 2015-02-27 DIAGNOSIS — D259 Leiomyoma of uterus, unspecified: Secondary | ICD-10-CM

## 2015-02-27 DIAGNOSIS — N8320 Unspecified ovarian cysts: Secondary | ICD-10-CM | POA: Diagnosis not present

## 2015-02-27 DIAGNOSIS — Z01419 Encounter for gynecological examination (general) (routine) without abnormal findings: Secondary | ICD-10-CM

## 2015-02-27 DIAGNOSIS — Z1231 Encounter for screening mammogram for malignant neoplasm of breast: Secondary | ICD-10-CM

## 2015-02-27 DIAGNOSIS — N83202 Unspecified ovarian cyst, left side: Secondary | ICD-10-CM

## 2015-02-27 DIAGNOSIS — N83201 Unspecified ovarian cyst, right side: Secondary | ICD-10-CM

## 2015-02-27 MED ORDER — NORETHINDRONE 0.35 MG PO TABS: 1.0000 | ORAL_TABLET | Freq: Every day | ORAL | Status: DC

## 2015-02-27 NOTE — Progress Notes (Signed)
49 y.o. G41P1001 Married Caucasian female here for annual exam.    C diff following abx for a root canal. Very upset. Still testing positive.  Seeing a GI - Dr. Sue Lush. Will do a colonoscopy.   On POP for menorrhagia.  Has small fibroids and bilateral simple ovarian cysts. Menses were irregular and occurring every two weeks, but now no cycles in 3 months.  Hgb was 12 this Spring. Some back pain starting 1 - 2 months after starting pills.   Hx HTN.  PCP:  [facility]   Patient's last menstrual period was 11/28/2014.          Sexually active: Yes.    The current method of family planning is  POP.  Exercising: Yes.    Walking Smoker:  no  Health Maintenance: Pap:  01/03/14 neg. HR HPV:neg History of abnormal Pap:  Yes, 1995 Colpo and Leep.  MMG:  Diagnostic 08/14/13 BIRADS1:Neg. [place] left BIRADS1:neg Colonoscopy:  Never BMD:   Never   TDaP:  2015 Screening Labs:  Hb today: PCP, Urine today: PCP   reports that she has never smoked. She has never used smokeless tobacco. She reports that she does not drink alcohol or use illicit drugs.  Past Medical History  Diagnosis Date  . Hypertension   . Anemia   . Glaucoma   . Abnormal Pap smear   . C. difficile diarrhea 12/2014  . Fibroids   . Bilateral ovarian cysts     simple    Past Surgical History  Procedure Laterality Date  . Cervical biopsy  w/ loop electrode excision  1995  . Cataract surgery Left 03/2013    Current Outpatient Prescriptions  Medication Sig Dispense Refill  . dorzolamide-timolol (COSOPT) 22.3-6.8 MG/ML ophthalmic solution Place 1 drop into the left eye 2 (two) times daily.    Marland Kitchen lisinopril (PRINIVIL,ZESTRIL) 10 MG tablet Take 1 tablet by mouth daily.    . metroNIDAZOLE (METROGEL) 0.75 % gel Apply 1 application topically 2 (two) times daily.    . norethindrone (MICRONOR,CAMILA,ERRIN) 0.35 MG tablet Take 1 tablet (0.35 mg total) by mouth daily. 1 Package 1  . ferrous sulfate dried (SLOW FE)  160 (50 FE) MG TBCR SR tablet Take 160 mg by mouth daily. Takes with menstrual cycles     No current facility-administered medications for this visit.    Family History  Problem Relation Age of Onset  . Hypertension Mother   . Hypertension Father   . Breast cancer Maternal Grandmother     meta to brain  . Alzheimer's disease Maternal Grandfather   . Colon cancer Paternal Grandmother   . Heart attack Paternal Grandfather 4    ROS:  Pertinent items are noted in HPI.  Otherwise, a comprehensive ROS was negative.  Exam:   BP 120/60 mmHg  Pulse 88  Resp 16  Ht 5' 4.75" (1.645 m)  Wt 174 lb (78.926 kg)  BMI 29.17 kg/m2  LMP 11/28/2014    General appearance: alert, cooperative and appears stated age Head: Normocephalic, without obvious abnormality, atraumatic Neck: no adenopathy, supple, symmetrical, trachea midline and thyroid normal to inspection and palpation Lungs: clear to auscultation bilaterally Breasts: normal appearance, no masses or tenderness, Inspection negative, No nipple retraction or dimpling, No nipple discharge or bleeding, No axillary or supraclavicular adenopathy Heart: regular rate and rhythm Abdomen: soft, non-tender; bowel sounds normal; no masses,  no organomegaly Extremities: extremities normal, atraumatic, no cyanosis or edema Skin: Skin color, texture, turgor normal. No rashes  or lesions Lymph nodes: Cervical, supraclavicular, and axillary nodes normal. No abnormal inguinal nodes palpated Neurologic: Grossly normal  Pelvic: External genitalia:  no lesions              Urethra:  normal appearing urethra with no masses, tenderness or lesions              Bartholins and Skenes: normal                 Vagina: normal appearing vagina with normal color and discharge, no lesions              Cervix: no lesions              Pap taken: Yes.   Bimanual Exam:  Uterus:  normal size, contour, position, consistency, mobility, non-tender              Adnexa: normal  adnexa and no mass, fullness, tenderness              Rectovaginal: No..  Confirms.              Anus:  normal sphincter tone, no lesions  Chaperone was present for exam.  Assessment:   Well woman visit with normal exam. HX LEEP.  On POP.  Amenorrhea.  Back ache.  Hx of small fibroids and bilateral ovarian cysts. C. Diff.  Plan: Yearly mammogram recommended after age 64.  Has appt. Today. Recommended self breast exam. Pap and HR HPV as above. Recommendation for Calcium, Vitamin D, regular exercise program including cardiovascular and weight bearing exercise. Labs performed.  No..     Refills given on medications.  Yes.  .  See orders.  Micronor.  Return for pelvic ultrasound for recheck.  Encouragement given for recovery from C. Diff! Follow up annually and prn.     After visit summary provided.

## 2015-02-27 NOTE — Patient Instructions (Signed)

## 2015-03-03 LAB — IPS PAP TEST WITH HPV

## 2015-03-11 ENCOUNTER — Telehealth: Payer: Self-pay | Admitting: Obstetrics and Gynecology

## 2015-03-11 NOTE — Telephone Encounter (Signed)
Verified benefits. Called patient to review and schedule. Left voicemail to return call. Benefits noted in guarantor account.

## 2015-03-13 NOTE — Telephone Encounter (Signed)
Patient returning your call okay to wait until Monday for call back she asked if she could be reached at this # [phone] and not to call her home # please.

## 2015-04-17 ENCOUNTER — Telehealth: Payer: Self-pay | Admitting: Obstetrics and Gynecology

## 2015-04-17 NOTE — Telephone Encounter (Signed)
Patient called to cancel appointment she said she will call us back to reschedule.

## 2015-04-30 ENCOUNTER — Other Ambulatory Visit: Payer: BLUE CROSS/BLUE SHIELD | Admitting: Obstetrics and Gynecology

## 2015-04-30 ENCOUNTER — Other Ambulatory Visit: Payer: BLUE CROSS/BLUE SHIELD

## 2016-01-20 ENCOUNTER — Other Ambulatory Visit: Payer: Self-pay | Admitting: Obstetrics and Gynecology

## 2016-01-20 DIAGNOSIS — Z1231 Encounter for screening mammogram for malignant neoplasm of breast: Secondary | ICD-10-CM

## 2016-01-30 ENCOUNTER — Other Ambulatory Visit: Payer: Self-pay | Admitting: Obstetrics and Gynecology

## 2016-02-02 NOTE — Telephone Encounter (Signed)
Medication refill request: Norethindrone 0.35MG  Last AEX:  02/27/15 BS Next AEX: 03/11/16 Last MMG (if hormonal medication request): 02/27/15 BIRADS1 negative Refill authorized: 02/27/15 #1 w/11 refills; today #28 w/2 refills

## 2016-02-03 ENCOUNTER — Telehealth: Payer: Self-pay | Admitting: Obstetrics and Gynecology

## 2016-02-03 MED ORDER — NORETHINDRONE 0.35 MG PO TABS: 1.0000 | ORAL_TABLET | Freq: Every day | ORAL | 1 refills | Status: DC

## 2016-02-03 NOTE — Telephone Encounter (Signed)
Patient was seen for last annual exam on 02/27/2015. Next annual exam scheduled for 03/11/2016 with Dr. Quincy Simmonds. Patient is currently taking Micronor for birth control and is requesting a refill until annual exam appointment. Last mammogram preformed on 02/27/2015, birads 1 negative. Refill for Micronor #1 1RF, sent to patients pharmacy on file. Spoke with patient. Advised refill has been sent to pharmacy in file. Patient is aware she is due for next screening mammogram on 02/27/2016 and will need to have this preformed prior to annual exam for further refills. Patient is agreeable and states that she has her mammogram scheduled for 03/11/2016 at [facility].  Routing to provider for final review. Patient is agreeable to disposition. Will close encounter.

## 2016-02-03 NOTE — Telephone Encounter (Signed)
Patient calling requesting a refill on her birth control for two months worth to last until her AEX 03/11/16. Pharmacy on file is correct.

## 2016-03-11 ENCOUNTER — Ambulatory Visit (INDEPENDENT_AMBULATORY_CARE_PROVIDER_SITE_OTHER): Payer: BLUE CROSS/BLUE SHIELD | Admitting: Obstetrics and Gynecology

## 2016-03-11 ENCOUNTER — Encounter: Payer: Self-pay | Admitting: Obstetrics and Gynecology

## 2016-03-11 ENCOUNTER — Ambulatory Visit
Admission: RE | Admit: 2016-03-11 | Discharge: 2016-03-11 | Disposition: A | Discharge status: Home / Self Care | Payer: BLUE CROSS/BLUE SHIELD | Source: Ambulatory Visit | Attending: Obstetrics and Gynecology | Admitting: Obstetrics and Gynecology

## 2016-03-11 VITALS — BP 102/68 | HR 72 | Resp 14 | Ht 64.0 in | Wt 184.4 lb

## 2016-03-11 DIAGNOSIS — Z1231 Encounter for screening mammogram for malignant neoplasm of breast: Secondary | ICD-10-CM

## 2016-03-11 DIAGNOSIS — N83201 Unspecified ovarian cyst, right side: Secondary | ICD-10-CM

## 2016-03-11 DIAGNOSIS — Z01419 Encounter for gynecological examination (general) (routine) without abnormal findings: Secondary | ICD-10-CM | POA: Diagnosis not present

## 2016-03-11 DIAGNOSIS — N83202 Unspecified ovarian cyst, left side: Secondary | ICD-10-CM

## 2016-03-11 MED ORDER — NORETHINDRONE 0.35 MG PO TABS: 1.0000 | ORAL_TABLET | Freq: Every day | ORAL | 3 refills | Status: DC

## 2016-03-11 NOTE — Patient Instructions (Addendum)

## 2016-03-11 NOTE — Progress Notes (Signed)
Patient ID: Lindsey Giles, female   DOB: 1966/05/17, 50 y.o.   MRN: [id number]  50 y.o. G44P1001 Married Caucasian female here for annual exam.   Asking for a pap/HPV testing due to her prior LEEP.  On POP for menorrhagia.  Forgot a pill yesterday.   Has small fibroids and bilateral simple ovarian cysts.  Last ultrasound in 2015: 2 fibroids - 16 mm and 19 mm. EMS - 11.87 mm. Right ovary 1 - 2 cm below umbilicus in right lateral quadrant.  39 x 28 m avascular echofree cyst to the right of the ovary.  Left ovary with thin walled cyst 28 x 19 mm.   Has some lower back ache bilaterally.  Not associated with anything.  No recent injury.  No numbness or weakness of lower extremities.  Asking for tx for rosacea. Uses Metrogel occasionally.  Has not seen a dermatologist in a long time.  C Diff has now resolved.   Hoping to have her parents move from [place] to [employer].  PCP:   [facility]  Patient's last menstrual period was 02/26/2016.    No menses for the 100 days prior.    Sexually active: Yes.    The current method of family planning is OCP (estrogen/progesterone).    Exercising: Yes.    walking Smoker:  no  Health Maintenance: Pap:  02/27/15, negative with neg HR HPV History of abnormal Pap:  Yes, 1995 [facility].  MMG: 03/11/16, no report at time of visit  Colonoscopy: never [id number] TDaP:  2015 Gardasil: N/A HIV: never Hep C: not indicated  Screening Labs:  PCP Hb today: PCP, Urine today: declined   reports that she has never smoked. She has never used smokeless tobacco. She reports that she does not drink alcohol or use drugs.  Past Medical History:  Diagnosis Date  . Abnormal Pap smear   . Anemia   . Bilateral ovarian cysts    simple  . C. difficile diarrhea 12/2014  . Fibroids   . Glaucoma   . Hypertension     Past Surgical History:  Procedure Laterality Date  . cataract surgery Left 03/2013  . CERVICAL BIOPSY  W/ LOOP  ELECTRODE EXCISION  1995    Current Outpatient Prescriptions  Medication Sig Dispense Refill  . dorzolamide-timolol (COSOPT) 22.3-6.8 MG/ML ophthalmic solution Place 1 drop into the left eye 2 (two) times daily.    . ferrous sulfate dried (SLOW FE) 160 (50 FE) MG TBCR SR tablet Take 160 mg by mouth daily. Takes with menstrual cycles    . lisinopril (PRINIVIL,ZESTRIL) 10 MG tablet Take 1 tablet by mouth daily.    . metroNIDAZOLE (METROGEL) 0.75 % gel Apply 1 application topically 2 (two) times daily.    . norethindrone (MICRONOR,CAMILA,ERRIN) 0.35 MG tablet Take 1 tablet (0.35 mg total) by mouth daily. 1 Package 1   No current facility-administered medications for this visit.     Family History  Problem Relation Age of Onset  . Hypertension Mother   . Hypertension Father   . Breast cancer Maternal Grandmother     meta to brain  . Alzheimer's disease Maternal Grandfather   . Colon cancer Paternal Grandmother   . Heart attack Paternal Grandfather 21    ROS:  Pertinent items are noted in HPI.  Otherwise, a comprehensive ROS was negative.  Exam:   LMP 02/26/2016     General appearance: alert, cooperative and appears stated age Head: Normocephalic, without obvious abnormality, atraumatic Neck:  no adenopathy, supple, symmetrical, trachea midline and thyroid normal to inspection and palpation Lungs: clear to auscultation bilaterally Breasts: normal appearance, no masses or tenderness, No nipple retraction or dimpling, No nipple discharge or bleeding, No axillary or supraclavicular adenopathy Heart: regular rate and rhythm Abdomen: soft, non-tender; no masses, no organomegaly Extremities: extremities normal, atraumatic, no cyanosis or edema Skin: Skin color, texture, turgor normal. No rashes or lesions Lymph nodes: Cervical, supraclavicular, and axillary nodes normal. No abnormal inguinal nodes palpated Neurologic: Grossly normal  Pelvic: External genitalia:  no lesions               Urethra:  normal appearing urethra with no masses, tenderness or lesions              Bartholins and Skenes: normal                 Vagina: normal appearing vagina with normal color and discharge, no lesions              Cervix: no lesions              Pap taken: Yes.   Bimanual Exam:  Uterus:  normal size, contour, position, consistency, mobility, non-tender              Adnexa:  Nontender.  Bilateral cysts?              Rectal exam: Yes.  .  Confirms.              Anus:  normal sphincter tone, no lesions  Chaperone was present for exam.  Assessment:   Well woman visit with normal exam. Hx LEEP.  On POP. Oligomenorrhea. Back ache. Uncertain etiology. Hx of small fibroids and bilateral ovarian cysts. C. Diff resolved.   Rosacea.   Plan: Yearly mammogram recommended after age 62.  Recommended self breast exam.  Pap and HR HPV as above. Guidelines for Calcium, Vitamin D, regular exercise program including cardiovascular and weight bearing exercise. Pelvic US. Colonoscopy recommended.  Patient will consider.  Continue Micronor. Know she is not protected against pregnancy right now due to to missed pill. Given name for dermatology group in [place].  Follow up annually and prn.       After visit summary provided.

## 2016-03-14 ENCOUNTER — Telehealth: Payer: Self-pay | Admitting: Obstetrics and Gynecology

## 2016-03-14 NOTE — Telephone Encounter (Signed)
Called patient to review benefits for a recommended procedure. Left Voicemail requesting a call back. °

## 2016-03-16 LAB — IPS PAP TEST WITH HPV

## 2016-03-24 ENCOUNTER — Ambulatory Visit (INDEPENDENT_AMBULATORY_CARE_PROVIDER_SITE_OTHER): Payer: BLUE CROSS/BLUE SHIELD

## 2016-03-24 ENCOUNTER — Ambulatory Visit (INDEPENDENT_AMBULATORY_CARE_PROVIDER_SITE_OTHER): Payer: BLUE CROSS/BLUE SHIELD | Admitting: Obstetrics and Gynecology

## 2016-03-24 ENCOUNTER — Encounter: Payer: Self-pay | Admitting: Obstetrics and Gynecology

## 2016-03-24 VITALS — BP 120/70 | HR 66 | Ht 64.0 in | Wt 183.0 lb

## 2016-03-24 DIAGNOSIS — N83202 Unspecified ovarian cyst, left side: Secondary | ICD-10-CM | POA: Diagnosis not present

## 2016-03-24 DIAGNOSIS — N949 Unspecified condition associated with female genital organs and menstrual cycle: Secondary | ICD-10-CM | POA: Diagnosis not present

## 2016-03-24 DIAGNOSIS — N83201 Unspecified ovarian cyst, right side: Secondary | ICD-10-CM

## 2016-03-24 DIAGNOSIS — D259 Leiomyoma of uterus, unspecified: Secondary | ICD-10-CM

## 2016-03-24 DIAGNOSIS — N9489 Other specified conditions associated with female genital organs and menstrual cycle: Secondary | ICD-10-CM

## 2016-03-24 NOTE — Patient Instructions (Signed)
Uterine Fibroids Uterine fibroids are tissue masses (tumors) that can develop in the womb (uterus). They are also called leiomyomas. This type of tumor is not cancerous (benign) and does not spread to other parts of the body outside of the pelvic area, which is between the hip bones. Occasionally, fibroids may develop in the fallopian tubes, in the cervix, or on the support structures (ligaments) that surround the uterus. You can have one or many fibroids. Fibroids can vary in size, weight, and where they grow in the uterus. Some can become quite large. Most fibroids do not require medical treatment. CAUSES A fibroid can develop when a single uterine cell keeps growing (replicating). Most cells in the human body have a control mechanism that keeps them from replicating without control. SIGNS AND SYMPTOMS Symptoms may include:   Heavy bleeding during your period.  Bleeding or spotting between periods.  Pelvic pain and pressure.  Bladder problems, such as needing to urinate more often (urinary frequency) or urgently.  Inability to reproduce offspring (infertility).  Miscarriages. DIAGNOSIS Uterine fibroids are diagnosed through a physical exam. Your health care provider may feel the lumpy tumors during a pelvic exam. Ultrasonography and an MRI may be done to determine the size, location, and number of fibroids. TREATMENT Treatment may include:  Watchful waiting. This involves getting the fibroid checked by your health care provider to see if it grows or shrinks. Follow your health care provider's recommendations for how often to have this checked.  Hormone medicines. These can be taken by mouth or given through an intrauterine device (IUD).  Surgery.  Removing the fibroids (myomectomy) or the uterus (hysterectomy).  Removing blood supply to the fibroids (uterine artery embolization). If fibroids interfere with your fertility and you want to become pregnant, your health care provider  may recommend having the fibroids removed.  HOME CARE INSTRUCTIONS  Keep all follow-up visits as directed by your health care provider. This is important.  Take medicines only as directed by your health care provider.  If you were prescribed a hormone treatment, take the hormone medicines exactly as directed.  Do not take aspirin, because it can cause bleeding.  Ask your health care provider about taking iron pills and increasing the amount of dark green, leafy vegetables in your diet. These actions can help to boost your blood iron levels, which may be affected by heavy menstrual bleeding.  Pay close attention to your period and tell your health care provider about any changes, such as:  Increased blood flow that requires you to use more pads or tampons than usual per month.  A change in the number of days that your period lasts per month.  A change in symptoms that are associated with your period, such as abdominal cramping or back pain. SEEK MEDICAL CARE IF:  You have pelvic pain, back pain, or abdominal cramps that cannot be controlled with medicines.  You have an increase in bleeding between and during periods.  You soak tampons or pads in a half hour or less.  You feel lightheaded, extra tired, or weak. SEEK IMMEDIATE MEDICAL CARE IF:  You faint.  You have a sudden increase in pelvic pain.   This information is not intended to replace advice given to you by your health care provider. Make sure you discuss any questions you have with your health care provider.   Document Released: 05/13/2000 Document Revised: 06/06/2014 Document Reviewed: 11/12/2013 Elsevier Interactive Patient Education 2016 Elsevier Inc.   Ovarian Cyst An ovarian cyst  is a fluid-filled sac that forms on an ovary. The ovaries are small organs that produce eggs in women. Various types of cysts can form on the ovaries. Most are not cancerous. Many do not cause problems, and they often go away on their  own. Some may cause symptoms and require treatment. Common types of ovarian cysts include:  Functional cysts--These cysts may occur every month during the menstrual cycle. This is normal. The cysts usually go away with the next menstrual cycle if the woman does not get pregnant. Usually, there are no symptoms with a functional cyst.  Endometrioma cysts--These cysts form from the tissue that lines the uterus. They are also called "chocolate cysts" because they become filled with blood that turns brown. This type of cyst can cause pain in the lower abdomen during intercourse and with your menstrual period.  Cystadenoma cysts--This type develops from the cells on the outside of the ovary. These cysts can get very big and cause lower abdomen pain and pain with intercourse. This type of cyst can twist on itself, cut off its blood supply, and cause severe pain. It can also easily rupture and cause a lot of pain.  Dermoid cysts--This type of cyst is sometimes found in both ovaries. These cysts may contain different kinds of body tissue, such as skin, teeth, hair, or cartilage. They usually do not cause symptoms unless they get very big.  Theca lutein cysts--These cysts occur when too much of a certain hormone (human chorionic gonadotropin) is produced and overstimulates the ovaries to produce an egg. This is most common after procedures used to assist with the conception of a baby (in vitro fertilization). CAUSES   Fertility drugs can cause a condition in which multiple large cysts are formed on the ovaries. This is called ovarian hyperstimulation syndrome.  A condition called polycystic ovary syndrome can cause hormonal imbalances that can lead to nonfunctional ovarian cysts. SIGNS AND SYMPTOMS  Many ovarian cysts do not cause symptoms. If symptoms are present, they may include:  Pelvic pain or pressure.  Pain in the lower abdomen.  Pain during sexual intercourse.  Increasing girth (swelling) of  the abdomen.  Abnormal menstrual periods.  Increasing pain with menstrual periods.  Stopping having menstrual periods without being pregnant. DIAGNOSIS  These cysts are commonly found during a routine or annual pelvic exam. Tests may be ordered to find out more about the cyst. These tests may include:  Ultrasound.  X-ray of the pelvis.  CT scan.  MRI.  Blood tests. TREATMENT  Many ovarian cysts go away on their own without treatment. Your health care provider may want to check your cyst regularly for 2-3 months to see if it changes. For women in menopause, it is particularly important to monitor a cyst closely because of the higher rate of ovarian cancer in menopausal women. When treatment is needed, it may include any of the following:  A procedure to drain the cyst (aspiration). This may be done using a long needle and ultrasound. It can also be done through a laparoscopic procedure. This involves using a thin, lighted tube with a tiny camera on the end (laparoscope) inserted through a small incision.  Surgery to remove the whole cyst. This may be done using laparoscopic surgery or an open surgery involving a larger incision in the lower abdomen.  Hormone treatment or birth control pills. These methods are sometimes used to help dissolve a cyst. HOME CARE INSTRUCTIONS   Only take over-the-counter or prescription medicines as  directed by your health care provider.  Follow up with your health care provider as directed.  Get regular pelvic exams and Pap tests. SEEK MEDICAL CARE IF:   Your periods are late, irregular, or painful, or they stop.  Your pelvic pain or abdominal pain does not go away.  Your abdomen becomes larger or swollen.  You have pressure on your bladder or trouble emptying your bladder completely.  You have pain during sexual intercourse.  You have feelings of fullness, pressure, or discomfort in your stomach.  You lose weight for no apparent  reason.  You feel generally ill.  You become constipated.  You lose your appetite.  You develop acne.  You have an increase in body and facial hair.  You are gaining weight, without changing your exercise and eating habits.  You think you are pregnant. SEEK IMMEDIATE MEDICAL CARE IF:   You have increasing abdominal pain.  You feel sick to your stomach (nauseous), and you throw up (vomit).  You develop a fever that comes on suddenly.  You have abdominal pain during a bowel movement.  Your menstrual periods become heavier than usual. MAKE SURE YOU:  Understand these instructions.  Will watch your condition.  Will get help right away if you are not doing well or get worse.   This information is not intended to replace advice given to you by your health care provider. Make sure you discuss any questions you have with your health care provider.   Document Released: 05/16/2005 Document Revised: 05/21/2013 Document Reviewed: 01/21/2013 Elsevier Interactive Patient Education [employer].

## 2016-03-24 NOTE — Progress Notes (Signed)
Patient ID: Lindsey Giles, female   DOB: January 28, 1966, 50 y.o.   MRN: [id number] GYNECOLOGY  VISIT   HPI: 50 y.o.   Married  Caucasian  female   G1P1001 with Patient's last menstrual period was 02/26/2016.   here for pelvic ultrasound for history of fibroids and bilateral ovarian cysts.  Right ovarian cyst has been present since at least 2014 when detected by outside physician.  Has had normal CA125 per patient.  Per patient, GYN ONC has declined to do consultation due to the reassuring clinical picture.   Last ultrasound was done in 2015: 2 fibroids - 16 mm and 19 mm. EMS - 11.87 mm. Right ovary 1 - 2 cm below umbilicus in right lateral quadrant.  39 x 28 m avascular echofree cyst to the right of the ovary.  Left ovary with thin walled cyst 28 x 19 mm.   GYNECOLOGIC HISTORY: Patient's last menstrual period was 02/26/2016. Contraception:  OCPs--Micronor Menopausal hormone therapy:  n/a Last mammogram:  03-11-16 Density B/Neg/biRads1:[facility] Last pap smear:   03-11-16 Neg:Neg HR HPV        OB History    Gravida Para Term Preterm AB Living   1 1 1  0 0 1   SAB TAB Ectopic Multiple Live Births   0 0 0 0 1         There are no active problems to display for this patient.   Past Medical History:  Diagnosis Date  . Abnormal Pap smear   . Anemia   . Bilateral ovarian cysts    simple  . C. difficile diarrhea 12/2014  . Fibroids   . Glaucoma   . Hypertension     Past Surgical History:  Procedure Laterality Date  . cataract surgery Left 03/2013  . CERVICAL BIOPSY  W/ LOOP ELECTRODE EXCISION  1995    Current Outpatient Prescriptions  Medication Sig Dispense Refill  . dorzolamide-timolol (COSOPT) 22.3-6.8 MG/ML ophthalmic solution Place 1 drop into the left eye 2 (two) times daily.    . ferrous sulfate dried (SLOW FE) 160 (50 FE) MG TBCR SR tablet Take 160 mg by mouth daily. Takes with menstrual cycles    . lisinopril (PRINIVIL,ZESTRIL) 5 MG tablet Take 5 mg by mouth  daily.    . metroNIDAZOLE (METROGEL) 0.75 % gel Apply 1 application topically 2 (two) times daily. Uses for rosacea    . Multiple Vitamins-Minerals (MULTIVITAMIN PO) Take by mouth.    . norethindrone (MICRONOR,CAMILA,ERRIN) 0.35 MG tablet Take 1 tablet (0.35 mg total) by mouth daily. 3 Package 3  . TURMERIC PO Take by mouth.     No current facility-administered medications for this visit.      ALLERGIES: Sudafed [pseudoephedrine hcl]  Family History  Problem Relation Age of Onset  . Hypertension Mother   . Hypertension Father   . Breast cancer Maternal Grandmother     meta to brain  . Alzheimer's disease Maternal Grandfather   . Colon cancer Paternal Grandmother   . Heart attack Paternal Grandfather 69    Social History   Social History  . Marital status: Married    Spouse name: N/A  . Number of children: N/A  . Years of education: N/A   Occupational History  . Not on file.   Social History Main Topics  . Smoking status: Never Smoker  . Smokeless tobacco: Never Used  . Alcohol use No  . Drug use: No  . Sexual activity: Yes    Partners: Male  Birth control/ protection: Pill   Other Topics Concern  . Not on file   Social History Narrative  . No narrative on file    ROS:  Pertinent items are noted in HPI.  PHYSICAL EXAMINATION:    BP 120/70   Pulse 66   Ht 5\' 4"  (1.626 m)   Wt 183 lb (83 kg)   LMP 02/26/2016   BMI 31.41 kg/m     General appearance: alert, cooperative and appears stated age   Pelvic ultrasound: Uterus with 7 fibroids, largest 22 mm. EMS 3.8 mm. Right ovary with follicle.  Right adnexa with thick walled cyst 39 x 37 x 27 mm with septation.  Ovarian verus tubal origin.  Left ovary normal with mild amount of free fluid.   ASSESSMENT  Fibroids. Increasing in number. Right adnexal mass, essentially unchanged.  PLAN  Discussion of fibroids and right adnexal mass.  We discussed the benign nature of fibroids and their ability to cause  menstrual bleeding abnormalities.  The right adnexal cyst does not necessitate surgical removal at this time.  I told the patient that I do not expect it to regress.  We will plan for an ultrasound every 2 years.  She will call if she experiences pain or abnormal uterine bleeding.  Folllow up prn.   An After Visit Summary was printed and given to the patient.  __15____ minutes face to face time of which over 50% was spent in counseling.

## 2016-03-24 NOTE — Progress Notes (Signed)
Encounter reviewed by Dr. Brook Amundson C. Silva.  

## 2016-03-25 ENCOUNTER — Ambulatory Visit: Payer: BLUE CROSS/BLUE SHIELD | Admitting: Obstetrics and Gynecology

## 2017-02-10 ENCOUNTER — Other Ambulatory Visit: Payer: Self-pay | Admitting: Obstetrics and Gynecology

## 2017-02-10 DIAGNOSIS — Z1231 Encounter for screening mammogram for malignant neoplasm of breast: Secondary | ICD-10-CM

## 2017-02-27 ENCOUNTER — Other Ambulatory Visit: Payer: Self-pay | Admitting: Obstetrics and Gynecology

## 2017-02-27 NOTE — Telephone Encounter (Signed)
Medication refill request: Lindsey Giles Last AEX:  03/11/16 BS Next AEX: 05/03/17 Last MMG (if hormonal medication request): 03/11/16 BIRADS 1 negative/density b -- scheduled for 03/23/17 at [facility] Refill authorized: 03/11/16 #3 w/3 refills; today please advise

## 2017-03-23 ENCOUNTER — Ambulatory Visit
Admission: RE | Admit: 2017-03-23 | Discharge: 2017-03-23 | Disposition: A | Discharge status: Home / Self Care | Payer: BLUE CROSS/BLUE SHIELD | Source: Ambulatory Visit | Attending: Obstetrics and Gynecology | Admitting: Obstetrics and Gynecology

## 2017-03-23 DIAGNOSIS — Z1231 Encounter for screening mammogram for malignant neoplasm of breast: Secondary | ICD-10-CM

## 2017-03-24 ENCOUNTER — Ambulatory Visit: Payer: BLUE CROSS/BLUE SHIELD | Admitting: Obstetrics and Gynecology

## 2017-05-03 ENCOUNTER — Other Ambulatory Visit: Payer: Self-pay

## 2017-05-03 ENCOUNTER — Ambulatory Visit (INDEPENDENT_AMBULATORY_CARE_PROVIDER_SITE_OTHER): Payer: BLUE CROSS/BLUE SHIELD | Admitting: Obstetrics and Gynecology

## 2017-05-03 ENCOUNTER — Encounter: Payer: Self-pay | Admitting: Obstetrics and Gynecology

## 2017-05-03 VITALS — BP 120/84 | HR 84 | Resp 14 | Ht 64.5 in | Wt 176.0 lb

## 2017-05-03 DIAGNOSIS — N912 Amenorrhea, unspecified: Secondary | ICD-10-CM | POA: Diagnosis not present

## 2017-05-03 DIAGNOSIS — Z01419 Encounter for gynecological examination (general) (routine) without abnormal findings: Secondary | ICD-10-CM | POA: Diagnosis not present

## 2017-05-03 DIAGNOSIS — Z1211 Encounter for screening for malignant neoplasm of colon: Secondary | ICD-10-CM

## 2017-05-03 DIAGNOSIS — N949 Unspecified condition associated with female genital organs and menstrual cycle: Secondary | ICD-10-CM | POA: Diagnosis not present

## 2017-05-03 NOTE — Progress Notes (Deleted)
51 y.o. G1P1001 MarriedCaucasianF here for annual exam.      No LMP recorded. Patient is perimenopausal.          Sexually active: {yes no:314532}  The current method of family planning is {contraception:315051}.    Exercising: {yes no:314532}  {types:19826} Smoker:  {YES NO:22349}  Health Maintenance: Pap:  *** History of abnormal Pap:  {YES NO:22349} MMG:  *** Colonoscopy:  *** BMD:   *** TDaP:  *** Gardasil: ***   reports that  has never smoked. she has never used smokeless tobacco. She reports that she does not drink alcohol or use drugs.  Past Medical History:  Diagnosis Date  . Abnormal Pap smear   . Anemia   . Bilateral ovarian cysts    simple  . C. difficile diarrhea 12/2014  . Fibroids   . Glaucoma   . Hypertension     Past Surgical History:  Procedure Laterality Date  . cataract surgery Left 03/2013  . CERVICAL BIOPSY  W/ LOOP ELECTRODE EXCISION  1995    Current Outpatient Medications  Medication Sig Dispense Refill  . dorzolamide-timolol (COSOPT) 22.3-6.8 MG/ML ophthalmic solution Place 1 drop into the left eye 2 (two) times daily.    Marland Kitchen ERRIN 0.35 MG tablet TAKE ONE TABLET BY MOUTH ONCE DAILY 84 tablet 0  . ferrous sulfate dried (SLOW FE) 160 (50 FE) MG TBCR SR tablet Take 160 mg by mouth daily. Takes with menstrual cycles    . lisinopril (PRINIVIL,ZESTRIL) 5 MG tablet Take 5 mg by mouth daily.    . metroNIDAZOLE (METROGEL) 0.75 % gel Apply 1 application topically 2 (two) times daily. Uses for rosacea    . Multiple Vitamins-Minerals (MULTIVITAMIN PO) Take by mouth.    . TURMERIC PO Take by mouth.     No current facility-administered medications for this visit.     Family History  Problem Relation Age of Onset  . Hypertension Mother   . Hypertension Father   . Breast cancer Maternal Grandmother        meta to brain  . Alzheimer's disease Maternal Grandfather   . Colon cancer Paternal Grandmother   . Heart attack Paternal Grandfather 30    Review  of Systems  Exam:   There were no vitals taken for this visit.  Weight change: @WEIGHTCHANGE @ Height:      Ht Readings from Last 3 Encounters:  03/24/16 5\' 4"  (1.626 m)  03/11/16 5\' 4"  (1.626 m)  02/27/15 5' 4.75" (1.645 m)    General appearance: alert, cooperative and appears stated age Head: Normocephalic, without obvious abnormality, atraumatic Neck: no adenopathy, supple, symmetrical, trachea midline and thyroid {CHL AMB PHY EX THYROID NORM DEFAULT:[id number]::"normal to inspection and palpation"} Lungs: clear to auscultation bilaterally Cardiovascular: regular rate and rhythm Breasts: {Exam; breast:13139::"normal appearance, no masses or tenderness"} Abdomen: soft, non-tender; non distended,  no masses,  no organomegaly Extremities: extremities normal, atraumatic, no cyanosis or edema Skin: Skin color, texture, turgor normal. No rashes or lesions Lymph nodes: Cervical, supraclavicular, and axillary nodes normal. No abnormal inguinal nodes palpated Neurologic: Grossly normal   Pelvic: External genitalia:  no lesions              Urethra:  normal appearing urethra with no masses, tenderness or lesions              Bartholins and Skenes: normal                 Vagina: normal appearing vagina with normal color and  discharge, no lesions              Cervix: {CHL AMB PHY EX CERVIX NORM [phone]::"no lesions"}               Bimanual Exam:  Uterus:  {CHL AMB PHY EX UTERUS NORM [phone]::"normal size, contour, position, consistency, mobility, non-tender"}              Adnexa: {CHL AMB PHY EX ADNEXA NO MASS DEFAULT:[phone]::"no mass, fullness, tenderness"}               Rectovaginal: Confirms               Anus:  normal sphincter tone, no lesions  Chaperone was present for exam.  A:  Well Woman with normal exam  P:

## 2017-05-03 NOTE — Progress Notes (Signed)
51 y.o. G1P1001 MarriedCaucasianF here for annual exam.  H/O small fibroids, has been on POP for menorrhagia.  H/O right adnexal cyst, f/u ultrasound recommended for 10/19 No pain. No cycle since March of 2018. She does c/o mild hot flashes, slight night sweats, so far symptoms are tolerable. Sexually active, some dryness. Husband is 60.  Son has been ill with Ulcerative Colitis, senior in college. She has been very worried about him.     No LMP recorded. Patient is perimenopausal.          Sexually active: Yes.    The current method of family planning is oral progesterone-only contraceptive.    Exercising: Yes.    walking Smoker:  no  Health Maintenance: Pap:  03/11/16 neg. HR HPV:neg   02/27/15 Neg. HR HPV:neg  History of abnormal Pap:  Yes, LEEP , 1994 MMG:  03/23/17 BIRADS1:Neg  Colonoscopy:  Never, plans to do.  BMD:   Never TDaP:  2015  Gardasil: N/A   reports that  has never smoked. she has never used smokeless tobacco. She reports that she does not drink alcohol or use drugs. Son at [employer], senior. Recently diagnosed with ulcerative colitis, has been having a hard time with the UC. Son is engaged and is at the top of his class at [employer].  She has a Oceanographer in Scientist, physiological. She works at [employer], works in [employer]. Lives an hour from there. Works in [employer].   Past Medical History:  Diagnosis Date  . Abnormal Pap smear   . Anemia   . Bilateral ovarian cysts    simple  . C. difficile diarrhea 12/2014  . Fibroids   . Glaucoma   . Hypertension   She was very sick with the C.diff for about a month. Started after taking antibiotics for root canal.   Past Surgical History:  Procedure Laterality Date  . cataract surgery Left 03/2013  . CERVICAL BIOPSY  W/ LOOP ELECTRODE EXCISION  1995    Current Outpatient Medications  Medication Sig Dispense Refill  . dorzolamide-timolol (COSOPT) 22.3-6.8 MG/ML ophthalmic solution Place 1 drop into the left eye 2  (two) times daily.    Marland Kitchen ERRIN 0.35 MG tablet TAKE ONE TABLET BY MOUTH ONCE DAILY 84 tablet 0  . ferrous sulfate dried (SLOW FE) 160 (50 FE) MG TBCR SR tablet Take 160 mg by mouth daily. Takes with menstrual cycles    . lisinopril (PRINIVIL,ZESTRIL) 5 MG tablet Take 5 mg by mouth daily.    . metroNIDAZOLE (METROGEL) 0.75 % gel Apply 1 application topically 2 (two) times daily. Uses for rosacea    . Multiple Vitamins-Minerals (MULTIVITAMIN PO) Take by mouth.    . TURMERIC PO Take by mouth.     No current facility-administered medications for this visit.     Family History  Problem Relation Age of Onset  . Hypertension Mother   . Hypertension Father   . Breast cancer Maternal Grandmother        meta to brain  . Alzheimer's disease Maternal Grandfather   . Colon cancer Paternal Grandmother   . Heart attack Paternal Grandfather 14  Grandmothers were both in their 58's with cancer diagnosis.   Review of Systems  Constitutional: Negative.   HENT: Negative.   Eyes: Negative.   Respiratory: Negative.   Cardiovascular: Negative.   Gastrointestinal: Negative.   Endocrine: Negative.   Genitourinary: Negative.   Musculoskeletal: Negative.   Skin: Negative.   Allergic/Immunologic: Negative.  Neurological: Negative.   Psychiatric/Behavioral: Negative.     Exam:   There were no vitals taken for this visit.  Weight change: @WEIGHTCHANGE @ Height:      Ht Readings from Last 3 Encounters:  03/24/16 5\' 4"  (1.626 m)  03/11/16 5\' 4"  (1.626 m)  02/27/15 5' 4.75" (1.645 m)    General appearance: alert, cooperative and appears stated age Head: Normocephalic, without obvious abnormality, atraumatic Neck: no adenopathy, supple, symmetrical, trachea midline and thyroid normal to inspection and palpation Lungs: clear to auscultation bilaterally Cardiovascular: regular rate and rhythm Breasts: normal appearance, no masses or tenderness Abdomen: soft, non-tender; non distended,  no masses,  no  organomegaly Extremities: extremities normal, atraumatic, no cyanosis or edema Skin: Skin color, texture, turgor normal. No rashes or lesions Lymph nodes: Cervical, supraclavicular, and axillary nodes normal. No abnormal inguinal nodes palpated Neurologic: Grossly normal   Pelvic: External genitalia:  no lesions              Urethra:  normal appearing urethra with no masses, tenderness or lesions              Bartholins and Skenes: normal                 Vagina: normal appearing vagina with normal color and discharge, no lesions              Cervix: no lesions               Bimanual Exam:  Uterus:  normal size, contour, position, consistency, mobility, non-tender              Adnexa: no mass, fullness, tenderness               Rectovaginal: Confirms               Anus:  normal sphincter tone, no lesions  Chaperone was present for exam.  A:  Well Woman with normal exam  H/O menorrhagia, controlled on POP  H/O adnexal cysts, due for a f/u ultrasound next year  P:   Trial off of POP  Check [facility] when she is one week off POP (will [facility], she will try and get it done near home, possibly with her primary)  U/S next year with annual   Mammogram UTD  Labs and immunizations UTD  Cologuard script sent

## 2017-05-03 NOTE — Patient Instructions (Addendum)
One week after stopping your pills get a [facility]  EXERCISE AND DIET:  We recommended that you start or continue a regular exercise program for good health. Regular exercise means any activity that makes your heart beat faster and makes you sweat.  We recommend exercising at least 30 minutes per day at least 3 days a week, preferably 4 or 5.  We also recommend a diet low in fat and sugar.  Inactivity, poor dietary choices and obesity can cause diabetes, heart attack, stroke, and kidney damage, among others.    ALCOHOL AND SMOKING:  Women should limit their alcohol intake to no more than 7 drinks/beers/glasses of wine (combined, not each!) per week. Moderation of alcohol intake to this level decreases your risk of breast cancer and liver damage. And of course, no recreational drugs are part of a healthy lifestyle.  And absolutely no smoking or even second hand smoke. Most people know smoking can cause heart and lung diseases, but did you know it also contributes to weakening of your bones? Aging of your skin?  Yellowing of your teeth and nails?  CALCIUM AND VITAMIN D:  Adequate intake of calcium and Vitamin D are recommended.  The recommendations for exact amounts of these supplements seem to change often, but generally speaking 600 mg of calcium (either carbonate or citrate) and 800 units of Vitamin D per day seems prudent. Certain women may benefit from higher intake of Vitamin D.  If you are among these women, your doctor will have told you during your visit.    PAP SMEARS:  Pap smears, to check for cervical cancer or precancers,  have traditionally been done yearly, although recent scientific advances have shown that most women can have pap smears less often.  However, every woman still should have a physical exam from her gynecologist every year. It will include a breast check, inspection of the vulva and vagina to check for abnormal growths or skin changes, a visual exam of the cervix, and then an exam to  evaluate the size and shape of the uterus and ovaries.  And after 51 years of age, a rectal exam is indicated to check for rectal cancers. We will also provide age appropriate advice regarding health maintenance, like when you should have certain vaccines, screening for sexually transmitted diseases, bone density testing, colonoscopy, mammograms, etc.   MAMMOGRAMS:  All women over 51 years old should have a yearly mammogram. Many facilities now offer a "3D" mammogram, which may cost around $50 extra out of pocket. If possible,  we recommend you accept the option to have the 3D mammogram performed.  It both reduces the number of women who will be called back for extra views which then turn out to be normal, and it is better than the routine mammogram at detecting truly abnormal areas.    COLONOSCOPY:  Colonoscopy to screen for colon cancer is recommended for all women at age 51.  We know, you hate the idea of the prep.  We agree, BUT, having colon cancer and not knowing it is worse!!  Colon cancer so often starts as a polyp that can be seen and removed at colonscopy, which can quite literally save your life!  And if your first colonoscopy is normal and you have no family history of colon cancer, most women don't have to have it again for 10 years.  Once every ten years, you can do something that may end up saving your life, right?  We will be happy  to help you get it scheduled when you are ready.  Be sure to check your insurance coverage so you understand how much it will cost.  It may be covered as a preventative service at no cost, but you should check your particular policy.

## 2017-06-02 LAB — COLOGUARD: Cologuard: NEGATIVE

## 2017-06-13 ENCOUNTER — Telehealth: Payer: Self-pay | Admitting: *Deleted

## 2017-06-13 ENCOUNTER — Other Ambulatory Visit: Payer: Self-pay | Admitting: *Deleted

## 2017-06-13 DIAGNOSIS — Z1211 Encounter for screening for malignant neoplasm of colon: Secondary | ICD-10-CM

## 2017-06-13 NOTE — Telephone Encounter (Signed)
Patient returning [name]'s call. °

## 2017-06-13 NOTE — Telephone Encounter (Signed)
Returned call to patient. Advised of negative cologuard results and patient verbalized understanding.   Patient agreeable to disposition. Will close encounter.

## 2017-06-13 NOTE — Telephone Encounter (Signed)
Call to patient's home number. Son, [place], answered. Unable to give details per DPR. Advised to have patient return call to office when she is available.

## 2017-07-18 ENCOUNTER — Encounter: Payer: Self-pay | Admitting: Obstetrics and Gynecology

## 2017-08-14 ENCOUNTER — Telehealth: Payer: Self-pay | Admitting: *Deleted

## 2017-08-14 NOTE — Telephone Encounter (Signed)
Tried to contact patient regarding Lindsey Giles needing to be checked- patient VM is not set up- will try back later -eh

## 2018-03-06 DIAGNOSIS — H26492 Other secondary cataract, left eye: Secondary | ICD-10-CM | POA: Insufficient documentation

## 2018-06-08 ENCOUNTER — Other Ambulatory Visit: Payer: Self-pay | Admitting: Obstetrics and Gynecology

## 2018-06-08 DIAGNOSIS — Z1231 Encounter for screening mammogram for malignant neoplasm of breast: Secondary | ICD-10-CM

## 2018-06-19 ENCOUNTER — Ambulatory Visit
Admission: RE | Admit: 2018-06-19 | Discharge: 2018-06-19 | Disposition: A | Discharge status: Home / Self Care | Payer: PRIVATE HEALTH INSURANCE | Source: Ambulatory Visit | Attending: Obstetrics and Gynecology | Admitting: Obstetrics and Gynecology

## 2018-06-19 DIAGNOSIS — Z1231 Encounter for screening mammogram for malignant neoplasm of breast: Secondary | ICD-10-CM

## 2018-07-04 ENCOUNTER — Ambulatory Visit: Payer: BLUE CROSS/BLUE SHIELD | Admitting: Obstetrics and Gynecology

## 2018-07-05 ENCOUNTER — Other Ambulatory Visit: Payer: Self-pay

## 2018-07-05 ENCOUNTER — Encounter: Payer: Self-pay | Admitting: Obstetrics and Gynecology

## 2018-07-05 ENCOUNTER — Ambulatory Visit: Payer: BLUE CROSS/BLUE SHIELD | Admitting: Obstetrics and Gynecology

## 2018-07-05 ENCOUNTER — Other Ambulatory Visit: Payer: Self-pay | Admitting: Emergency Medicine

## 2018-07-05 ENCOUNTER — Ambulatory Visit (INDEPENDENT_AMBULATORY_CARE_PROVIDER_SITE_OTHER): Payer: BLUE CROSS/BLUE SHIELD

## 2018-07-05 VITALS — BP 142/82 | HR 76 | Wt 191.0 lb

## 2018-07-05 DIAGNOSIS — N949 Unspecified condition associated with female genital organs and menstrual cycle: Secondary | ICD-10-CM

## 2018-07-05 DIAGNOSIS — N9489 Other specified conditions associated with female genital organs and menstrual cycle: Secondary | ICD-10-CM | POA: Diagnosis not present

## 2018-07-05 DIAGNOSIS — Z01419 Encounter for gynecological examination (general) (routine) without abnormal findings: Secondary | ICD-10-CM | POA: Diagnosis not present

## 2018-07-05 NOTE — Progress Notes (Unsigned)
img 

## 2018-07-05 NOTE — Progress Notes (Signed)
53 y.o. G75P1001 Married White or Caucasian Not Hispanic or Latino female here for annual exam and consult following PUS. Reports she had one episode of spotting in 04/2018 following intercourse, felt very dry. No other bleeding. She has a h/o a right adnexal cyst.  Mild vasomotor symptoms, tolerable.  Her son graduated from college and got married last year. He is on a biologic for his Ulcerative Colitis, she is so worried about him.      Patient's last menstrual period was 07/28/2016.          Sexually active: Yes.    The current method of family planning is post menopausal status.    Exercising: Yes.    walking Smoker:  no  Health Maintenance: Pap:  03/11/16 neg. HR HPV:neg              02/27/15 Neg. HR HPV:neg  History of abnormal Pap:  Yes, LEEP , 1994 MMG:  06/19/2018 Birads 1 negative Colonoscopy:  Cologuard negative 12/18 BMD:   Never TDaP:  2015  Gardasil: N/A   reports that she has never smoked. She has never used smokeless tobacco. She reports that she does not drink alcohol or use drugs. Son has graduated from college, has ulcerative colitis.  She has a Oceanographer in Scientist, physiological. She works at [employer], works in [employer]. Lives an hour from there. Works in [employer]. Husband is 81, healthy.   Past Medical History:  Diagnosis Date  . Abnormal Pap smear   . Anemia   . Bilateral ovarian cysts    simple  . C. difficile diarrhea 12/2014  . Fibroids   . Glaucoma   . Hypertension     Past Surgical History:  Procedure Laterality Date  . cataract surgery Left 03/2013  . CERVICAL BIOPSY  W/ LOOP ELECTRODE EXCISION  1995    Current Outpatient Medications  Medication Sig Dispense Refill  . dorzolamide-timolol (COSOPT) 22.3-6.8 MG/ML ophthalmic solution Place 1 drop into the left eye 2 (two) times daily.    Marland Kitchen lisinopril (PRINIVIL,ZESTRIL) 5 MG tablet Take 5 mg by mouth daily.    . metroNIDAZOLE (METROGEL) 0.75 % gel Apply 1 application topically 2 (two) times daily.  Uses for rosacea    . Multiple Vitamins-Minerals (MULTIVITAMIN PO) Take by mouth.    . TURMERIC PO Take by mouth.     No current facility-administered medications for this visit.     Family History  Problem Relation Age of Onset  . Hypertension Mother   . Hypertension Father   . Breast cancer Maternal Grandmother        meta to brain  . Alzheimer's disease Maternal Grandfather   . Colon cancer Paternal Grandmother   . Heart attack Paternal Grandfather 63    Review of Systems  Constitutional: Negative.   HENT: Negative.   Eyes: Negative.   Respiratory: Negative.   Cardiovascular: Negative.   Gastrointestinal: Negative.   Endocrine: Negative.   Genitourinary: Negative.   Musculoskeletal: Negative.   Skin: Negative.   Allergic/Immunologic: Negative.   Neurological: Negative.   Hematological: Negative.   Psychiatric/Behavioral: Negative.     Exam:   BP (!) 142/82 (BP Location: Right Arm, Patient Position: Sitting, Cuff Size: Normal)   Pulse 76   Wt 191 lb (86.6 kg)   LMP 07/28/2016   BMI 32.28 kg/m   Weight change: @WEIGHTCHANGE @ Height:      Ht Readings from Last 3 Encounters:  05/03/17 5' 4.5" (1.638 m)  03/24/16 5\' 4"  (1.626  m)  03/11/16 5\' 4"  (1.626 m)    General appearance: alert, cooperative and appears stated age Head: Normocephalic, without obvious abnormality, atraumatic Neck: no adenopathy, supple, symmetrical, trachea midline and thyroid normal to inspection and palpation Lungs: clear to auscultation bilaterally Cardiovascular: regular rate and rhythm Breasts: normal appearance, no masses or tenderness Abdomen: soft, non-tender; non distended,  no masses,  no organomegaly Extremities: extremities normal, atraumatic, no cyanosis or edema Skin: Skin color, texture, turgor normal. No rashes or lesions Lymph nodes: Cervical, supraclavicular, and axillary nodes normal. No abnormal inguinal nodes palpated Neurologic: Grossly normal   Pelvic: External  genitalia:  no lesions              Urethra:  normal appearing urethra with no masses, tenderness or lesions              Bartholins and Skenes: normal                 Vagina: normal appearing vagina with normal color and discharge, no lesions              Cervix: no lesions               Bimanual Exam:  Uterus:  normal size, contour, position, consistency, mobility, non-tender              Adnexa: no mass, fullness, tenderness               Rectovaginal: Confirms               Anus:  normal sphincter tone, no lesions  Chaperone was present for exam.  A:  Well Woman with normal exam  Right adnexal mass, previously thought to possibly be her tube. Slightly larger than at her last scan 2 years ago  P:   Labs with primary  Will set up MRI to better evaluate right adnexal mass   Pap next year  Mammogram UTD  Cologuard UTD  Discussed breast self exam  Discussed calcium and vit D intake

## 2018-07-05 NOTE — Patient Instructions (Signed)

## 2018-07-06 ENCOUNTER — Telehealth: Payer: Self-pay | Admitting: Emergency Medicine

## 2018-07-06 DIAGNOSIS — N838 Other noninflammatory disorders of ovary, fallopian tube and broad ligament: Secondary | ICD-10-CM

## 2018-07-06 DIAGNOSIS — N9489 Other specified conditions associated with female genital organs and menstrual cycle: Secondary | ICD-10-CM

## 2018-07-06 NOTE — Telephone Encounter (Signed)
-----   Message from Salvadore Dom, MD sent at 07/05/2018  2:45 PM EST ----- This patient needs a pelvic MRI to evaluate a adnexal mass (with and without contrast). Growing slightly on ultrasound, not clear if it is her tube or ovary.  She lives in [place]. ? MRI in [place] vs [place]. I was going to place the order and realized I wasn't sure how. Can you please place the order and give the above information to J.F. Villareal. Thanks,Jill

## 2018-07-10 NOTE — Telephone Encounter (Signed)
Patient received a call from [facility] to schedule Pelvic MRI.   Patient thinks she would like to have MRI done in [place]. Thinks she should plan her care there.  Advised to please call back with location and will order to the location of her choice.   Pt agreeable.   Encounter closed.

## 2018-07-13 ENCOUNTER — Telehealth: Payer: Self-pay | Admitting: Obstetrics and Gynecology

## 2018-07-13 DIAGNOSIS — N949 Unspecified condition associated with female genital organs and menstrual cycle: Secondary | ICD-10-CM

## 2018-07-13 DIAGNOSIS — N838 Other noninflammatory disorders of ovary, fallopian tube and broad ligament: Secondary | ICD-10-CM

## 2018-07-13 DIAGNOSIS — N9489 Other specified conditions associated with female genital organs and menstrual cycle: Secondary | ICD-10-CM

## 2018-07-13 NOTE — Telephone Encounter (Signed)
Message left to return call to [place] at [phone].   Still needs Pelvic MRI.  Last message was patient was going to schedule in [place] and I asked her to call back with the imaging center of her choice.

## 2018-07-13 NOTE — Telephone Encounter (Signed)
Patient had ultrasound 07/05/2018 and states Dr. Talbert Nan recommended having a pelvic MRI of cyst on ovary. Would like to know if that is something that Dr. Talbert Nan would still like her to have done and if so, would like an order put in at [facility].

## 2018-07-16 NOTE — Telephone Encounter (Signed)
Spoke with patient and confirmed she now wants to have Pelvic MRI completed at [facility].  Reordered and patient will call to schedule, I provided her with all contact information at [facility].

## 2018-07-29 ENCOUNTER — Ambulatory Visit
Admission: RE | Admit: 2018-07-29 | Discharge: 2018-07-29 | Disposition: A | Discharge status: Home / Self Care | Payer: BLUE CROSS/BLUE SHIELD | Source: Ambulatory Visit | Attending: Obstetrics and Gynecology | Admitting: Obstetrics and Gynecology

## 2018-07-29 DIAGNOSIS — N9489 Other specified conditions associated with female genital organs and menstrual cycle: Secondary | ICD-10-CM

## 2018-07-29 DIAGNOSIS — N949 Unspecified condition associated with female genital organs and menstrual cycle: Secondary | ICD-10-CM

## 2018-07-29 DIAGNOSIS — N838 Other noninflammatory disorders of ovary, fallopian tube and broad ligament: Secondary | ICD-10-CM

## 2018-07-29 MED ORDER — GADOBENATE DIMEGLUMINE 529 MG/ML IV SOLN
18.0000 mL | Freq: Once | INTRAVENOUS | Status: AC | PRN
  Administered 2018-07-29: 18 mL via INTRAVENOUS

## 2019-03-05 DIAGNOSIS — H35372 Puckering of macula, left eye: Secondary | ICD-10-CM | POA: Insufficient documentation

## 2019-05-30 ENCOUNTER — Other Ambulatory Visit: Payer: Self-pay | Admitting: Obstetrics and Gynecology

## 2019-05-30 DIAGNOSIS — Z1231 Encounter for screening mammogram for malignant neoplasm of breast: Secondary | ICD-10-CM

## 2019-07-03 ENCOUNTER — Other Ambulatory Visit: Payer: Self-pay | Admitting: *Deleted

## 2019-07-03 DIAGNOSIS — N9489 Other specified conditions associated with female genital organs and menstrual cycle: Secondary | ICD-10-CM

## 2019-07-09 ENCOUNTER — Ambulatory Visit (INDEPENDENT_AMBULATORY_CARE_PROVIDER_SITE_OTHER): Payer: BC Managed Care – PPO | Admitting: Obstetrics and Gynecology

## 2019-07-09 ENCOUNTER — Encounter: Payer: Self-pay | Admitting: Obstetrics and Gynecology

## 2019-07-09 ENCOUNTER — Ambulatory Visit (INDEPENDENT_AMBULATORY_CARE_PROVIDER_SITE_OTHER): Payer: BC Managed Care – PPO

## 2019-07-09 ENCOUNTER — Other Ambulatory Visit: Payer: Self-pay

## 2019-07-09 VITALS — BP 110/74 | HR 93 | Temp 97.9°F | Ht 64.5 in | Wt 197.0 lb

## 2019-07-09 DIAGNOSIS — N9489 Other specified conditions associated with female genital organs and menstrual cycle: Secondary | ICD-10-CM

## 2019-07-09 DIAGNOSIS — N949 Unspecified condition associated with female genital organs and menstrual cycle: Secondary | ICD-10-CM

## 2019-07-09 DIAGNOSIS — Z01419 Encounter for gynecological examination (general) (routine) without abnormal findings: Secondary | ICD-10-CM

## 2019-07-09 NOTE — Progress Notes (Signed)
54 y.o. G77P1001 Married White or Caucasian Not Hispanic or Latino female here for annual exam and pelvic ultrasound. No vaginal bleeding. Sexually active, some dryness, helped with lubricant.     Her son has ulcerative colitis, she worries about him.   Patient's last menstrual period was 07/28/2016.          Sexually active: Yes.    The current method of family planning is post menopausal status.    Exercising: Yes.    Walking 3-4 miles  Smoker:  no  Health Maintenance: Pap:  03/11/16 neg. HR HPV:neg  02/27/15 Neg. HR HPV:neg  History of abnormal Pap:  Yes leep 1994  MMG: 06/19/18 Bi-rads 1 neg  Colonoscopy:Cologuard negative 12/18 [id number] Gardasil:N/A   reports that she has never smoked. She has never used smokeless tobacco. She reports that she does not drink alcohol or use drugs. Son has graduated from college, has ulcerative colitis, not in remission but is better. Her daughter in law is having a baby next week.  She has a Oceanographer in Scientist, physiological. She works at [employer] (for [employer]), works in [employer]. Lives an hour from there. Works in [employer].Husband is 63.  Past Medical History:  Diagnosis Date  . Abnormal Pap smear   . Anemia   . Bilateral ovarian cysts    simple  . C. difficile diarrhea 12/2014  . Fibroids   . Glaucoma   . Hypertension     Past Surgical History:  Procedure Laterality Date  . cataract surgery Left 03/2013  . CERVICAL BIOPSY  W/ LOOP ELECTRODE EXCISION  1995    Current Outpatient Medications  Medication Sig Dispense Refill  . dorzolamide-timolol (COSOPT) 22.3-6.8 MG/ML ophthalmic solution Place 1 drop into the left eye 2 (two) times daily.    Marland Kitchen lisinopril (PRINIVIL,ZESTRIL) 5 MG tablet Take 5 mg by mouth daily.    . metroNIDAZOLE (METROGEL) 0.75 % gel Apply 1 application topically 2 (two) times daily. Uses for rosacea    . Multiple Vitamins-Minerals (MULTIVITAMIN PO) Take by mouth.    . TURMERIC  PO Take by mouth.     No current facility-administered medications for this visit.    Family History  Problem Relation Age of Onset  . Hypertension Mother   . Hypertension Father   . Breast cancer Maternal Grandmother        meta to brain  . Alzheimer's disease Maternal Grandfather   . Colon cancer Paternal Grandmother   . Heart attack Paternal Grandfather 43    Review of Systems  All other systems reviewed and are negative.   Exam:   BP 110/74   Pulse 93   Temp 97.9 F (36.6 C)   Ht 5' 4.5" (1.638 m)   Wt 197 lb (89.4 kg)   LMP 07/28/2016   SpO2 97%   BMI 33.29 kg/m   Weight change: @WEIGHTCHANGE @ Height:   Height: 5' 4.5" (163.8 cm)  Ht Readings from Last 3 Encounters:  07/09/19 5' 4.5" (1.638 m)  05/03/17 5' 4.5" (1.638 m)  03/24/16 5\' 4"  (1.626 m)    General appearance: alert, cooperative and appears stated age Head: Normocephalic, without obvious abnormality, atraumatic Neck: no adenopathy, supple, symmetrical, trachea midline and thyroid normal to inspection and palpation Lungs: clear to auscultation bilaterally Cardiovascular: regular rate and rhythm Breasts: normal appearance, no masses or tenderness Abdomen: soft, non-tender; non distended,  no masses,  no organomegaly Extremities: extremities normal, atraumatic, no cyanosis or edema Skin: Skin color, texture, turgor  normal. No rashes or lesions Lymph nodes: Cervical, supraclavicular, and axillary nodes normal. No abnormal inguinal nodes palpated Neurologic: Grossly normal   Pelvic: External genitalia:  no lesions              Urethra:  normal appearing urethra with no masses, tenderness or lesions              Bartholins and Skenes: normal                 Vagina: normal appearing vagina with normal color and discharge, no lesions              Cervix: no lesions               Bimanual Exam:  Uterus:  normal size, contour, position, consistency, mobility, non-tender              Adnexa: no mass,  fullness, tenderness               Rectovaginal: Confirms               Anus:  normal sphincter tone, no lesions  Gae Dry chaperoned for the exam.  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

## 2019-07-09 NOTE — Patient Instructions (Signed)

## 2019-07-12 ENCOUNTER — Ambulatory Visit: Payer: PRIVATE HEALTH INSURANCE

## 2019-08-21 ENCOUNTER — Other Ambulatory Visit: Payer: Self-pay

## 2019-08-21 ENCOUNTER — Ambulatory Visit
Admission: RE | Admit: 2019-08-21 | Discharge: 2019-08-21 | Disposition: A | Discharge status: Home / Self Care | Payer: PRIVATE HEALTH INSURANCE | Source: Ambulatory Visit | Attending: Obstetrics and Gynecology | Admitting: Obstetrics and Gynecology

## 2019-08-21 DIAGNOSIS — Z1231 Encounter for screening mammogram for malignant neoplasm of breast: Secondary | ICD-10-CM

## 2020-07-07 ENCOUNTER — Other Ambulatory Visit: Payer: Self-pay | Admitting: Obstetrics and Gynecology

## 2020-07-07 DIAGNOSIS — Z1231 Encounter for screening mammogram for malignant neoplasm of breast: Secondary | ICD-10-CM

## 2020-07-09 ENCOUNTER — Encounter: Payer: Self-pay | Admitting: Obstetrics and Gynecology

## 2020-07-09 ENCOUNTER — Other Ambulatory Visit (HOSPITAL_COMMUNITY)
Admission: RE | Admit: 2020-07-09 | Discharge: 2020-07-09 | Disposition: A | Discharge status: Home / Self Care | Payer: BC Managed Care – PPO | Source: Ambulatory Visit | Attending: Obstetrics and Gynecology | Admitting: Obstetrics and Gynecology

## 2020-07-09 ENCOUNTER — Ambulatory Visit: Payer: BC Managed Care – PPO | Admitting: Obstetrics and Gynecology

## 2020-07-09 ENCOUNTER — Other Ambulatory Visit: Payer: Self-pay

## 2020-07-09 VITALS — BP 122/74 | HR 81 | Ht 65.0 in | Wt 198.2 lb

## 2020-07-09 DIAGNOSIS — Z1151 Encounter for screening for human papillomavirus (HPV): Secondary | ICD-10-CM | POA: Insufficient documentation

## 2020-07-09 DIAGNOSIS — Z01419 Encounter for gynecological examination (general) (routine) without abnormal findings: Secondary | ICD-10-CM | POA: Diagnosis not present

## 2020-07-09 DIAGNOSIS — Z124 Encounter for screening for malignant neoplasm of cervix: Secondary | ICD-10-CM

## 2020-07-09 DIAGNOSIS — Z1211 Encounter for screening for malignant neoplasm of colon: Secondary | ICD-10-CM | POA: Insufficient documentation

## 2020-07-09 NOTE — Patient Instructions (Signed)

## 2020-07-09 NOTE — Progress Notes (Signed)
55 y.o. G65P1001 Married White or Caucasian Not Hispanic or Latino female here for annual exam.  No vaginal bleeding. No dyspareunia with lubrication.   No bowel or bladder c/o.   She started medication in the last year for anxiety and depression. She is doing better on the medication. Lots of loss with the Pandemic.     Patient's last menstrual period was 07/28/2016.          Sexually active: Yes.    The current method of family planning is post menopausal status.    Exercising: No.  The patient does not participate in regular exercise at present. Smoker:  no  Health Maintenance: Pap: 03/11/16 neg. HR HPV:neg  02/27/15 Neg. HR HPV:neg  History of abnormal Pap:  Yes yes in her 20's had a leep.  MMG:  3/24/21Density B Bi-rads 1 Neg BMD:   Never  Colonoscopy: Cologuard Neg 12/18  TDaP:   2015  Gardasil: NA   reports that she has never smoked. She has never used smokeless tobacco. She reports that she does not drink alcohol and does not use drugs. She has a Oceanographer in Scientist, physiological. She works at [employer] (for [employer]), works in [employer]. Lives an hour from there. Works in [employer].Husband is 60. Son has ulcerative colitis, in remission. Yolanda Bonine is almost 1.   Past Medical History:  Diagnosis Date  . Abnormal Pap smear   . Anemia   . Bilateral ovarian cysts    simple  . C. difficile diarrhea 12/2014  . Fibroids   . Glaucoma   . Hypertension     Past Surgical History:  Procedure Laterality Date  . cataract surgery Left 03/2013  . CERVICAL BIOPSY  W/ LOOP ELECTRODE EXCISION  1995    Current Outpatient Medications  Medication Sig Dispense Refill  . dorzolamide-timolol (COSOPT) 22.3-6.8 MG/ML ophthalmic solution Place 1 drop into the left eye 2 (two) times daily.    Marland Kitchen escitalopram (LEXAPRO) 10 MG tablet Take 10 mg by mouth daily.    Marland Kitchen lisinopril (PRINIVIL,ZESTRIL) 5 MG tablet Take 5 mg by mouth daily.    . metroNIDAZOLE (METROGEL) 0.75 % gel  Apply 1 application topically 2 (two) times daily. Uses for rosacea    . Multiple Vitamins-Minerals (MULTIVITAMIN PO) Take by mouth.    . TURMERIC PO Take by mouth.     No current facility-administered medications for this visit.    Family History  Problem Relation Age of Onset  . Hypertension Mother   . Hypertension Father   . Breast cancer Maternal Grandmother        meta to brain  . Alzheimer's disease Maternal Grandfather   . Colon cancer Paternal Grandmother   . Heart attack Paternal Grandfather 5    Review of Systems  All other systems reviewed and are negative.   Exam:   BP 122/74   Pulse 81   Ht 5\' 5"  (1.651 m)   Wt 198 lb 3.2 oz (89.9 kg)   LMP 07/28/2016   SpO2 100%   BMI 32.98 kg/m   Weight change: @WEIGHTCHANGE @ Height:   Height: 5\' 5"  (165.1 cm)  Ht Readings from Last 3 Encounters:  07/09/20 5\' 5"  (1.651 m)  07/09/19 5' 4.5" (1.638 m)  05/03/17 5' 4.5" (1.638 m)    General appearance: alert, cooperative and appears stated age Head: Normocephalic, without obvious abnormality, atraumatic Neck: no adenopathy, supple, symmetrical, trachea midline and thyroid normal to inspection and palpation Lungs: clear to auscultation bilaterally Cardiovascular:  regular rate and rhythm Breasts: normal appearance, no masses or tenderness Abdomen: soft, non-tender; non distended,  no masses,  no organomegaly Extremities: extremities normal, atraumatic, no cyanosis or edema Skin: Skin color, texture, turgor normal. No rashes or lesions Lymph nodes: Cervical, supraclavicular, and axillary nodes normal. No abnormal inguinal nodes palpated Neurologic: Grossly normal   Pelvic: External genitalia:  no lesions              Urethra:  normal appearing urethra with no masses, tenderness or lesions              Bartholins and Skenes: normal                 Vagina: normal appearing vagina with normal color and discharge, no lesions              Cervix: no lesions                Bimanual Exam:  Uterus:  no masses or tenderness              Adnexa: no mass, fullness, tenderness               Rectovaginal: Confirms               Anus:  normal sphincter tone, no lesions  Gae Dry chaperoned for the exam.  1. Well woman exam Discussed breast self exam Discussed calcium and vit D intake Mammogram next month  2. Screening for cervical cancer  - Cytology - PAP  3. Colon cancer screening  - Cologuard

## 2020-07-13 LAB — CYTOLOGY - PAP
Comment: NEGATIVE
Diagnosis: NEGATIVE
High risk HPV: NEGATIVE

## 2020-08-25 ENCOUNTER — Ambulatory Visit: Payer: BC Managed Care – PPO

## 2020-09-05 ENCOUNTER — Ambulatory Visit
Admission: RE | Admit: 2020-09-05 | Discharge: 2020-09-05 | Disposition: A | Discharge status: Home / Self Care | Payer: BC Managed Care – PPO | Source: Ambulatory Visit | Attending: Obstetrics and Gynecology | Admitting: Obstetrics and Gynecology

## 2020-09-05 ENCOUNTER — Other Ambulatory Visit: Payer: Self-pay

## 2020-09-05 DIAGNOSIS — Z1231 Encounter for screening mammogram for malignant neoplasm of breast: Secondary | ICD-10-CM

## 2021-10-07 ENCOUNTER — Other Ambulatory Visit: Payer: Self-pay | Admitting: Obstetrics and Gynecology

## 2021-10-07 DIAGNOSIS — Z1231 Encounter for screening mammogram for malignant neoplasm of breast: Secondary | ICD-10-CM

## 2021-11-23 ENCOUNTER — Ambulatory Visit
Admission: RE | Admit: 2021-11-23 | Discharge: 2021-11-23 | Disposition: A | Discharge status: Home / Self Care | Payer: BC Managed Care – PPO | Source: Ambulatory Visit | Attending: Obstetrics and Gynecology | Admitting: Obstetrics and Gynecology

## 2021-11-23 DIAGNOSIS — Z1231 Encounter for screening mammogram for malignant neoplasm of breast: Secondary | ICD-10-CM

## 2022-10-10 ENCOUNTER — Other Ambulatory Visit: Payer: Self-pay | Admitting: Obstetrics and Gynecology

## 2022-10-10 DIAGNOSIS — Z1231 Encounter for screening mammogram for malignant neoplasm of breast: Secondary | ICD-10-CM

## 2022-12-13 ENCOUNTER — Ambulatory Visit
Admission: RE | Admit: 2022-12-13 | Discharge: 2022-12-13 | Disposition: A | Discharge status: Home / Self Care | Payer: BC Managed Care – PPO | Source: Ambulatory Visit | Attending: Obstetrics and Gynecology | Admitting: Obstetrics and Gynecology

## 2022-12-13 DIAGNOSIS — Z1231 Encounter for screening mammogram for malignant neoplasm of breast: Secondary | ICD-10-CM

## 2023-01-24 IMAGING — MG MM DIGITAL SCREENING BILAT W/ TOMO AND CAD
8 series · 8 of 24 positions shown · non-contrast
Comparison: Previous exam(s).

CLINICAL DATA: Screening.

EXAM:
DIGITAL SCREENING BILATERAL MAMMOGRAM WITH TOMOSYNTHESIS AND CAD
TECHNIQUE: Bilateral screening digital craniocaudal and mediolateral oblique
mammograms were obtained. Bilateral screening digital breast
tomosynthesis was performed. The images were evaluated with
computer-aided detection.

[R CC synth-2D]
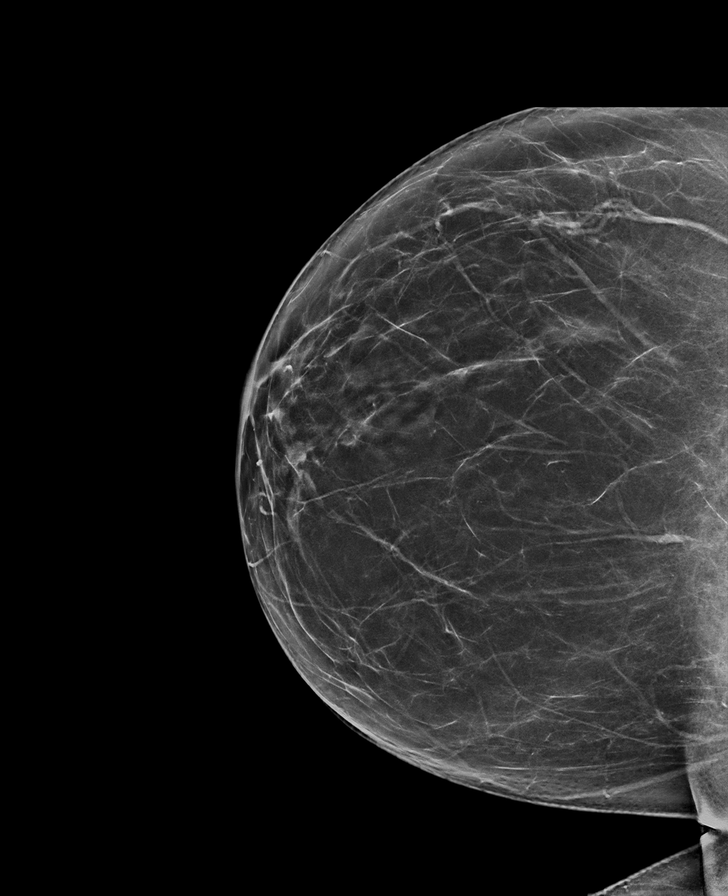

[L CC synth-2D]
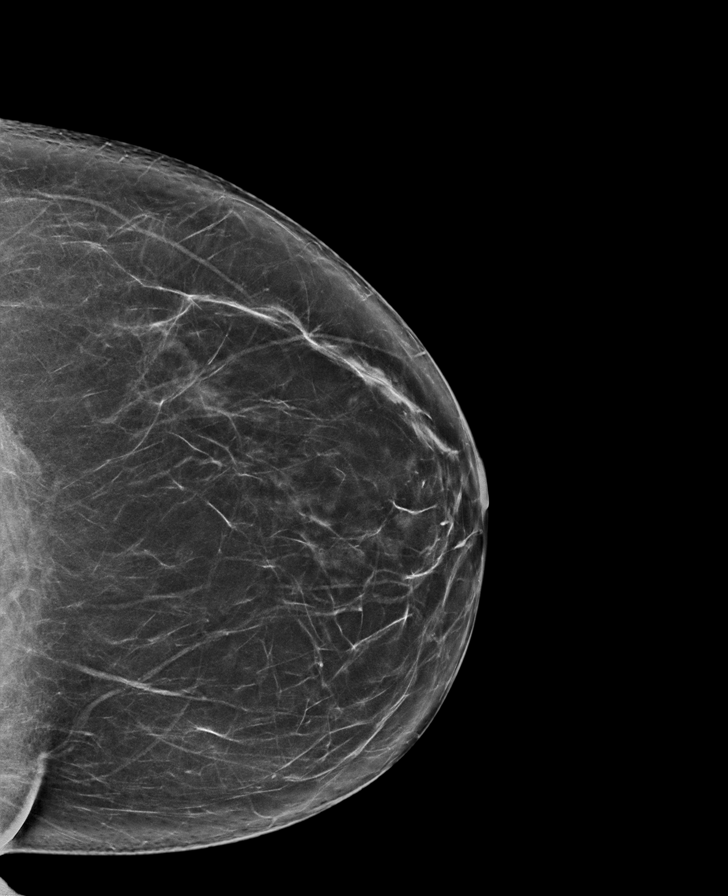

[R MLO synth-2D]
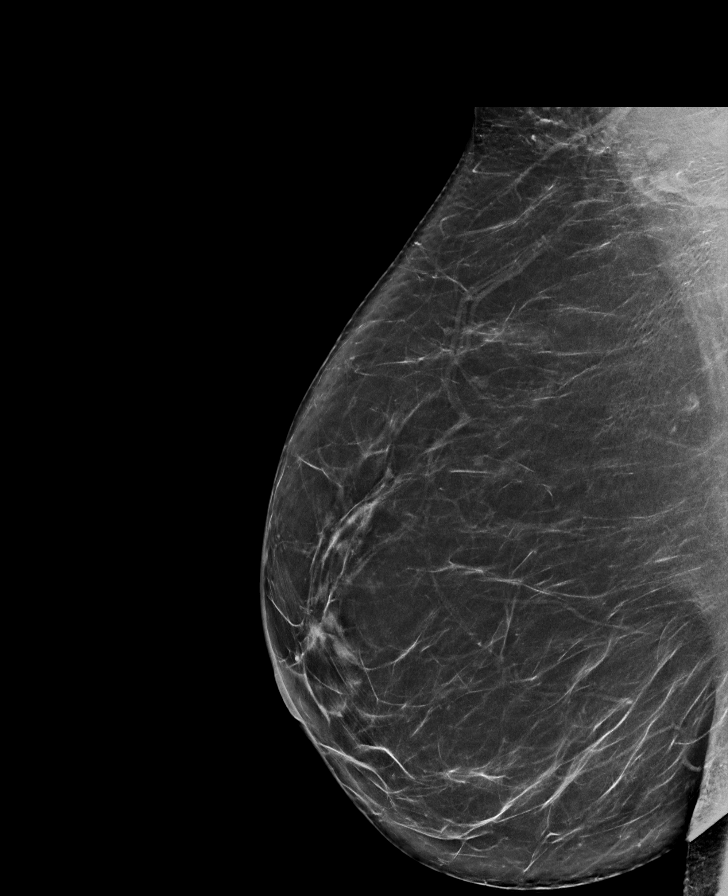

[L MLO synth-2D]
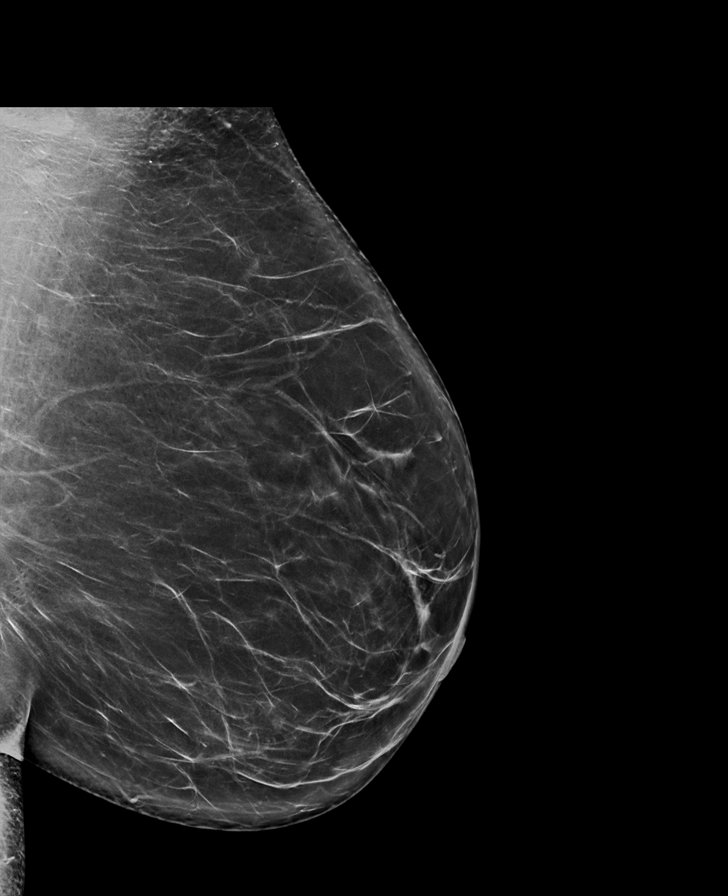

[L CC tomo · tomo slice 39/76.0]
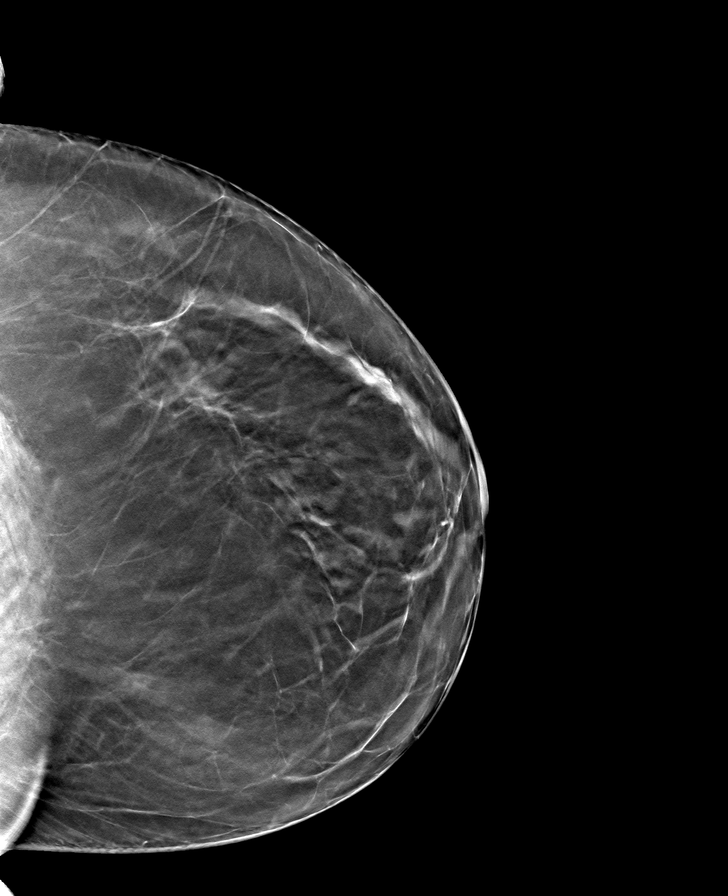

[R MLO tomo · tomo slice 44/87.0]
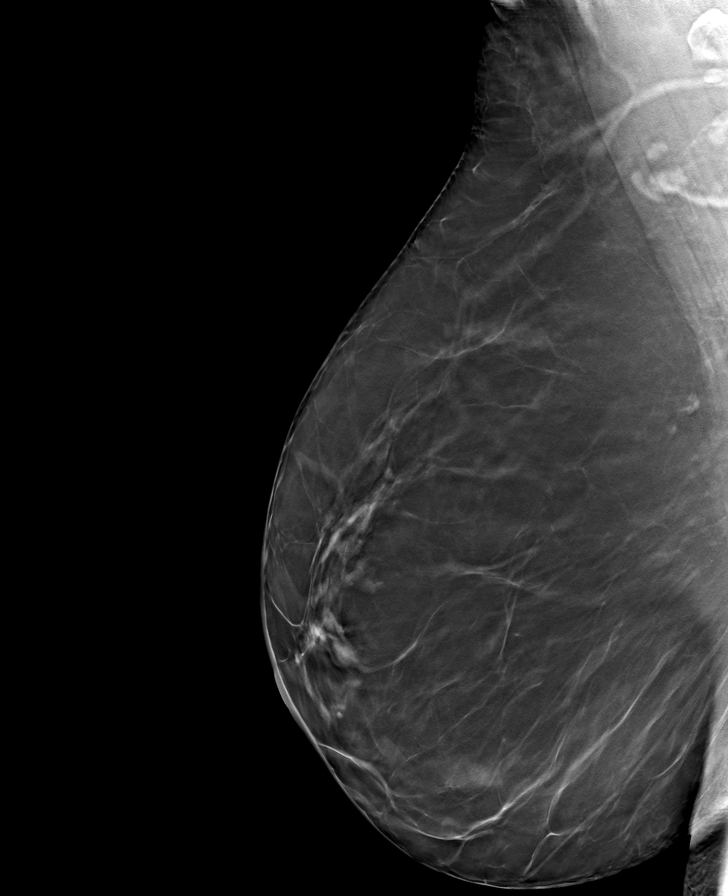

[L MLO tomo · tomo slice 42/83.0]
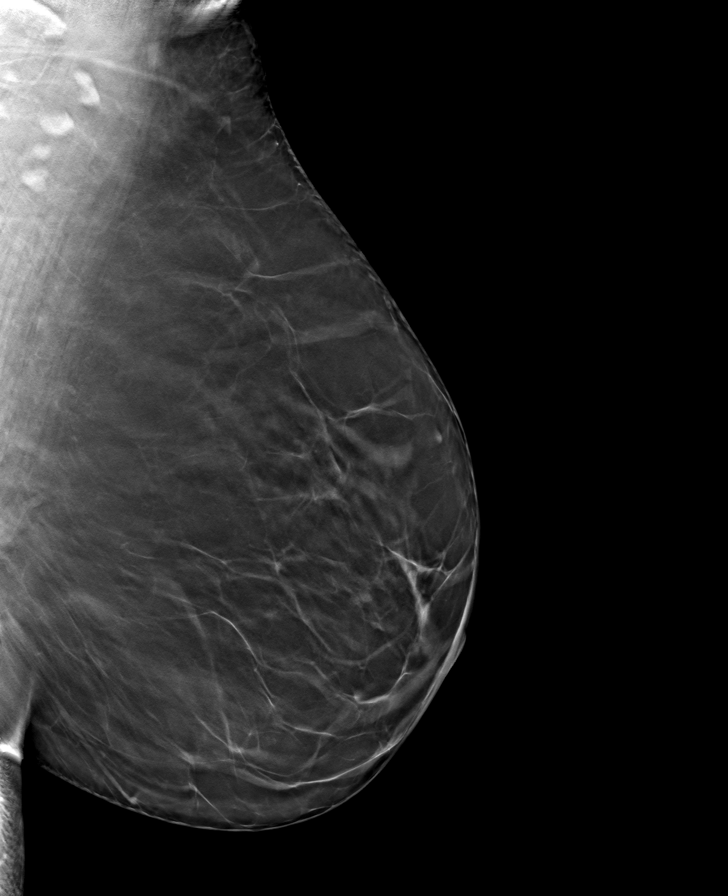

[R CC tomo · tomo slice 39/77.0]
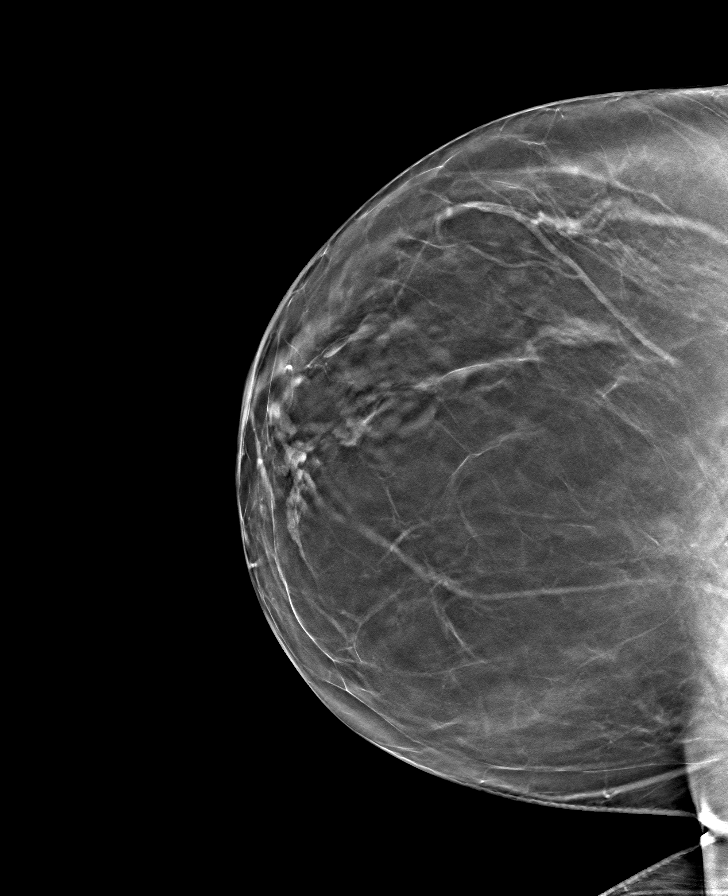

[8 of 24 positions shown; findings below may reference images not displayed]

ACR Breast Density Category b: There are scattered areas of
fibroglandular density.
FINDINGS: There are no findings suspicious for malignancy. The images were
evaluated with computer-aided detection.
IMPRESSION: No mammographic evidence of malignancy. A result letter of this
screening mammogram will be mailed directly to the patient.

RECOMMENDATION:
Screening mammogram in one year. (Code:WJ-[id number])

BI-RADS CATEGORY  1: Negative.

## 2023-10-30 ENCOUNTER — Other Ambulatory Visit: Payer: Self-pay | Admitting: Internal Medicine

## 2023-10-30 DIAGNOSIS — Z1231 Encounter for screening mammogram for malignant neoplasm of breast: Secondary | ICD-10-CM

## 2023-12-12 ENCOUNTER — Other Ambulatory Visit: Payer: Self-pay | Admitting: Medical Genetics

## 2023-12-19 ENCOUNTER — Ambulatory Visit
Admission: RE | Admit: 2023-12-19 | Discharge: 2023-12-19 | Disposition: A | Discharge status: Home / Self Care | Source: Ambulatory Visit | Attending: Internal Medicine | Admitting: Internal Medicine

## 2023-12-19 DIAGNOSIS — Z1231 Encounter for screening mammogram for malignant neoplasm of breast: Secondary | ICD-10-CM

## 2024-03-21 ENCOUNTER — Other Ambulatory Visit: Payer: Self-pay | Admitting: Medical Genetics

## 2024-03-21 DIAGNOSIS — Z006 Encounter for examination for normal comparison and control in clinical research program: Secondary | ICD-10-CM
